# Patient Record
Sex: Female | Born: 1970 | Race: Black or African American | Hispanic: No | Marital: Single | Smoking: Former smoker
Health system: Southern US, Community
[De-identification: ages and names within clinical notes are randomized; demographics above are authoritative.]

## PROBLEM LIST (undated history)

## (undated) DIAGNOSIS — I10 Essential (primary) hypertension: Secondary | ICD-10-CM

## (undated) DIAGNOSIS — J45909 Unspecified asthma, uncomplicated: Secondary | ICD-10-CM

## (undated) HISTORY — DX: Essential (primary) hypertension: I10

---

## 1999-04-07 ENCOUNTER — Encounter: Payer: Self-pay | Admitting: Family Medicine

## 1999-04-07 ENCOUNTER — Ambulatory Visit (HOSPITAL_COMMUNITY): Admission: RE | Admit: 1999-04-07 | Discharge: 1999-04-07 | Payer: Self-pay | Admitting: Family Medicine

## 2001-12-26 ENCOUNTER — Emergency Department (HOSPITAL_COMMUNITY): Admission: EM | Admit: 2001-12-26 | Discharge: 2001-12-26 | Payer: Self-pay | Admitting: Emergency Medicine

## 2001-12-26 ENCOUNTER — Encounter: Payer: Self-pay | Admitting: Emergency Medicine

## 2003-03-26 ENCOUNTER — Emergency Department: Admit: 2003-03-26 | Payer: Self-pay | Source: Emergency Department | Admitting: Emergency Medicine

## 2003-10-23 ENCOUNTER — Emergency Department: Admit: 2003-10-23 | Payer: Self-pay | Source: Emergency Department

## 2005-05-16 ENCOUNTER — Emergency Department: Admit: 2005-05-16 | Payer: Self-pay | Source: Emergency Department | Admitting: Emergency Medicine

## 2008-06-07 ENCOUNTER — Emergency Department: Admit: 2008-06-07 | Payer: Self-pay | Source: Emergency Department | Admitting: Emergency Medicine

## 2008-06-07 LAB — CBC AND DIFFERENTIAL
Basophils Absolute: 0 /mm3 (ref 0.0–0.2)
Basophils: 0 % (ref 0–2)
Eosinophils Absolute: 0.1 /mm3 (ref 0.0–0.7)
Eosinophils: 2 % (ref 0–5)
Granulocytes Absolute: 4.3 /mm3 (ref 1.8–8.1)
Hematocrit: 39 % (ref 37.0–47.0)
Hgb: 13 G/DL (ref 12.0–16.0)
Immature Granulocytes Absolute: 0
Immature Granulocytes: 0 %
Lymphocytes Absolute: 1.1 /mm3 (ref 0.5–4.4)
Lymphocytes: 18 % (ref 15–41)
MCH: 28.3 PG (ref 28.0–32.0)
MCHC: 33.3 G/DL (ref 32.0–36.0)
MCV: 85 FL (ref 80.0–100.0)
MPV: 10.5 FL (ref 9.4–12.3)
Monocytes Absolute: 0.4 /mm3 (ref 0.0–1.2)
Monocytes: 6 % (ref 0–11)
Neutrophils %: 73 % (ref 52–75)
Platelets: 362 /mm3 (ref 140–400)
RBC: 4.59 /mm3 (ref 4.20–5.40)
RDW: 15.4 % — ABNORMAL HIGH (ref 11.5–15.0)
WBC: 5.93 /mm3 (ref 3.50–10.80)

## 2008-06-07 LAB — BASIC METABOLIC PANEL
BUN: 7 mg/dL — ABNORMAL LOW (ref 8–20)
CO2: 25 mEq/L (ref 21–30)
Calcium: 9.8 mg/dL (ref 8.6–10.2)
Chloride: 109 mEq/L — ABNORMAL HIGH (ref 98–107)
Creatinine: 0.9 mg/dL (ref 0.6–1.5)
Glucose: 88 mg/dL (ref 70–100)
Potassium: 4.1 mEq/L (ref 3.6–5.0)
Sodium: 141 mEq/L (ref 136–146)

## 2008-06-07 LAB — GFR

## 2009-09-29 ENCOUNTER — Emergency Department: Admit: 2009-09-29 | Payer: Self-pay | Source: Emergency Department | Admitting: Emergency Medicine

## 2009-09-29 LAB — COMPREHENSIVE METABOLIC PANEL
ALT: 13 U/L (ref 0–55)
AST (SGOT): 22 U/L (ref 5–34)
Albumin/Globulin Ratio: 1.2 (ref 0.9–2.2)
Albumin: 3.8 g/dL (ref 3.5–5.0)
Alkaline Phosphatase: 53 U/L (ref 40–150)
BUN: 12 mg/dL (ref 7.0–19.0)
Bilirubin, Total: 0.3 mg/dL (ref 0.2–1.2)
CO2: 17 mEq/L — ABNORMAL LOW (ref 22–29)
Calcium: 9.3 mg/dL (ref 8.5–10.5)
Chloride: 111 mEq/L — ABNORMAL HIGH (ref 98–107)
Creatinine: 0.8 mg/dL (ref 0.6–1.0)
Globulin: 3.3 g/dL (ref 2.0–3.6)
Glucose: 81 mg/dL (ref 70–100)
Potassium: 4.3 mEq/L (ref 3.5–5.1)
Protein, Total: 7.1 g/dL (ref 6.0–8.3)
Sodium: 144 mEq/L (ref 136–145)

## 2009-09-29 LAB — CBC AND DIFFERENTIAL
Basophils Absolute: 0 10*3/uL (ref 0.00–0.20)
Basophils: 0 % (ref 0–2)
Eosinophils Absolute: 0 10*3/uL (ref 0.00–0.70)
Eosinophils: 1 % (ref 0–5)
Granulocytes Absolute: 4.3 10*3/uL (ref 1.80–8.10)
Hematocrit: 37.2 % (ref 37.0–47.0)
Hgb: 12.4 g/dL (ref 12.0–16.0)
Immature Granulocytes Absolute: 0.01 10*3/uL
Immature Granulocytes: 0 % (ref 0–1)
Lymphocytes Absolute: 1.6 10*3/uL (ref 0.50–4.40)
Lymphocytes: 25 % (ref 15–41)
MCH: 28.3 pg (ref 28.0–32.0)
MCHC: 33.3 g/dL (ref 32.0–36.0)
MCV: 84.9 fL (ref 80.0–100.0)
MPV: 9.9 fL (ref 9.4–12.3)
Monocytes Absolute: 0.4 10*3/uL (ref 0.00–1.20)
Monocytes: 6 % (ref 0–11)
Neutrophils %: 67 % (ref 52–75)
Platelets: 296 10*3/uL (ref 140–400)
RBC: 4.38 10*6/uL (ref 4.20–5.40)
RDW: 15 % (ref 12–15)
WBC: 6.36 10*3/uL (ref 3.50–10.80)

## 2009-09-29 LAB — LIPASE: Lipase: 18 U/L (ref 8–78)

## 2009-09-29 LAB — HEMOLYSIS INDEX: Hemolysis Index: 23

## 2009-09-29 LAB — GFR: EGFR: >60

## 2010-03-24 ENCOUNTER — Emergency Department: Admit: 2010-03-24 | Payer: Self-pay | Source: Emergency Department | Admitting: Emergency Medicine

## 2010-03-24 LAB — HEMOLYSIS INDEX: Hemolysis Index: 21 Index — ABNORMAL HIGH (ref 0–18)

## 2010-03-24 LAB — COMPREHENSIVE METABOLIC PANEL
ALT: 12 U/L (ref 0–55)
AST (SGOT): 18 U/L (ref 5–34)
Albumin/Globulin Ratio: 1.2 (ref 0.9–2.2)
Albumin: 3.8 g/dL (ref 3.5–5.0)
Alkaline Phosphatase: 58 U/L (ref 40–150)
Anion Gap: 10 (ref 5.0–15.0)
BUN: 9 mg/dL (ref 7.0–19.0)
Bilirubin, Total: 0.6 mg/dL (ref 0.2–1.2)
CO2: 18 mEq/L — ABNORMAL LOW (ref 22–29)
Calcium: 9.2 mg/dL (ref 8.5–10.5)
Chloride: 108 mEq/L — ABNORMAL HIGH (ref 98–107)
Creatinine: 0.8 mg/dL (ref 0.6–1.0)
Globulin: 3.3 g/dL (ref 2.0–3.6)
Glucose: 102 mg/dL — ABNORMAL HIGH (ref 70–100)
Potassium: 4 mEq/L (ref 3.5–5.1)
Protein, Total: 7.1 g/dL (ref 6.0–8.3)
Sodium: 136 mEq/L (ref 136–145)

## 2010-03-24 LAB — CBC AND DIFFERENTIAL
Baso(Absolute): 0.02 10*3/uL (ref 0.00–0.20)
Basophils: 0 % (ref 0–2)
Eosinophils Absolute: 0.14 10*3/uL (ref 0.00–0.70)
Eosinophils: 3 % (ref 0–5)
Hematocrit: 34.6 % — ABNORMAL LOW (ref 37.0–47.0)
Hgb: 11.8 g/dL — ABNORMAL LOW (ref 12.0–16.0)
Immature Granulocytes Absolute: 0.01 10*3/uL
Immature Granulocytes: 0 % (ref 0–1)
Lymphocytes Absolute: 1.95 10*3/uL (ref 0.50–4.40)
Lymphocytes: 44 % — ABNORMAL HIGH (ref 15–41)
MCH: 27.4 pg — ABNORMAL LOW (ref 28.0–32.0)
MCHC: 34.1 g/dL (ref 32.0–36.0)
MCV: 80.5 fL (ref 80.0–100.0)
MPV: 10.2 fL (ref 9.4–12.3)
Monocytes Absolute: 0.31 10*3/uL (ref 0.00–1.20)
Monocytes: 7 % (ref 0–11)
Neutrophils Absolute: 2.01 10*3/uL
Neutrophils: 45 % — ABNORMAL LOW (ref 52–75)
Platelets: 291 10*3/uL (ref 140–400)
RBC: 4.3 10*6/uL (ref 4.20–5.40)
RDW: 16 % — ABNORMAL HIGH (ref 12–15)
WBC: 4.43 10*3/uL (ref 3.50–10.80)

## 2010-03-24 LAB — D-DIMER - SOFT: D-Dimer: 138 ng/ml DDU (ref 0–399)

## 2010-03-24 LAB — GFR: EGFR: >60

## 2010-03-24 LAB — TROPONIN I: Troponin I: <0.01 ng/mL (ref 0.00–0.09)

## 2010-06-04 ENCOUNTER — Emergency Department: Admit: 2010-06-04 | Payer: Self-pay | Source: Emergency Department | Admitting: Emergency Medicine

## 2010-06-04 LAB — COMPREHENSIVE METABOLIC PANEL
ALT: 6 U/L (ref 0–55)
AST (SGOT): 13 U/L (ref 5–34)
Albumin/Globulin Ratio: 1 (ref 0.9–2.2)
Albumin: 3.5 g/dL (ref 3.5–5.0)
Alkaline Phosphatase: 61 U/L (ref 40–150)
Anion Gap: 8 (ref 5.0–15.0)
BUN: 10 mg/dL (ref 7.0–19.0)
Bilirubin, Total: 0.2 mg/dL (ref 0.2–1.2)
CO2: 25 mEq/L (ref 22–29)
Calcium: 9.8 mg/dL (ref 8.5–10.5)
Chloride: 105 mEq/L (ref 98–107)
Creatinine: 0.8 mg/dL (ref 0.6–1.0)
Globulin: 3.6 g/dL (ref 2.0–3.6)
Glucose: 91 mg/dL (ref 70–100)
Potassium: 4 mEq/L (ref 3.5–5.1)
Protein, Total: 7.1 g/dL (ref 6.0–8.3)
Sodium: 138 mEq/L (ref 136–145)

## 2010-06-04 LAB — URINALYSIS, REFLEX TO MICROSCOPIC EXAM IF INDICATED
Bilirubin, UA: NEGATIVE
Blood, UA: NEGATIVE
Glucose, UA: NEGATIVE
Ketones UA: NEGATIVE
Leukocyte Esterase, UA: NEGATIVE
Nitrite, UA: NEGATIVE
Protein, UR: NEGATIVE
Specific Gravity UA POCT: 1.015 (ref 1.001–1.035)
Urine pH: 6 (ref 5.0–8.0)
Urobilinogen, UA: 2 mg/dL

## 2010-06-04 LAB — CBC AND DIFFERENTIAL
Baso(Absolute): 0.02 10*3/uL (ref 0.00–0.20)
Basophils: 0 % (ref 0–2)
Eosinophils Absolute: 0.32 10*3/uL (ref 0.00–0.70)
Eosinophils: 5 % (ref 0–5)
Hematocrit: 35.5 % — ABNORMAL LOW (ref 37.0–47.0)
Hgb: 12 g/dL (ref 12.0–16.0)
Immature Granulocytes Absolute: 0.01 10*3/uL
Immature Granulocytes: 0 % (ref 0–1)
Lymphocytes Absolute: 2.63 10*3/uL (ref 0.50–4.40)
Lymphocytes: 42 % — ABNORMAL HIGH (ref 15–41)
MCH: 28.2 pg (ref 28.0–32.0)
MCHC: 33.8 g/dL (ref 32.0–36.0)
MCV: 83.5 fL (ref 80.0–100.0)
MPV: 10 fL (ref 9.4–12.3)
Monocytes Absolute: 0.44 10*3/uL (ref 0.00–1.20)
Monocytes: 7 % (ref 0–11)
Neutrophils Absolute: 2.85 10*3/uL
Neutrophils: 46 % — ABNORMAL LOW (ref 52–75)
Platelets: 330 10*3/uL (ref 140–400)
RBC: 4.25 10*6/uL (ref 4.20–5.40)
RDW: 16 % — ABNORMAL HIGH (ref 12–15)
WBC: 6.26 10*3/uL (ref 3.50–10.80)

## 2010-06-04 LAB — TROPONIN I: Troponin I: <0.01 ng/mL (ref 0.00–0.09)

## 2010-06-04 LAB — GFR: EGFR: >60

## 2010-06-04 LAB — HEMOLYSIS INDEX: Hemolysis Index: 11 Index (ref 0–18)

## 2011-05-22 LAB — ECG 12-LEAD
Atrial Rate: 73 {beats}/min
P Axis: 64 degrees
P-R Interval: 150 ms
Q-T Interval: 374 ms
QRS Duration: 88 ms
QTC Calculation (Bezet): 412 ms
R Axis: 40 degrees
T Axis: 18 degrees
Ventricular Rate: 73 {beats}/min

## 2011-05-23 LAB — ECG 12-LEAD
Atrial Rate: 76 {beats}/min
P Axis: 54 degrees
P-R Interval: 152 ms
Q-T Interval: 372 ms
QRS Duration: 94 ms
QTC Calculation (Bezet): 418 ms
R Axis: 30 degrees
T Axis: 29 degrees
Ventricular Rate: 76 {beats}/min

## 2011-06-06 LAB — ECG 12-LEAD
Atrial Rate: 98 {beats}/min
P Axis: 108 degrees
P-R Interval: 130 ms
Q-T Interval: 352 ms
QRS Duration: 88 ms
QTC Calculation (Bezet): 449 ms
R Axis: 134 degrees
T Axis: 152 degrees
Ventricular Rate: 98 {beats}/min

## 2011-07-27 ENCOUNTER — Emergency Department
Admit: 2011-07-27 | Disposition: A | Discharge status: Home / Self Care | Payer: Self-pay | Source: Emergency Department | Admitting: Emergency Medicine

## 2011-07-27 LAB — COMPREHENSIVE METABOLIC PANEL
ALT: 7 U/L (ref 0–55)
AST (SGOT): 15 U/L (ref 5–34)
Albumin/Globulin Ratio: 1.1 (ref 0.9–2.2)
Albumin: 4.1 g/dL (ref 3.5–5.0)
Alkaline Phosphatase: 69 U/L (ref 40–150)
Anion Gap: 9 (ref 5.0–15.0)
BUN: 9 mg/dL (ref 7.0–19.0)
Bilirubin, Total: 0.3 mg/dL (ref 0.2–1.2)
CO2: 23 mEq/L (ref 22–29)
Calcium: 9.9 mg/dL (ref 8.5–10.5)
Chloride: 108 mEq/L — ABNORMAL HIGH (ref 98–107)
Creatinine: 1 mg/dL (ref 0.6–1.0)
Globulin: 3.7 g/dL — ABNORMAL HIGH (ref 2.0–3.6)
Glucose: 93 mg/dL (ref 70–100)
Potassium: 3.7 mEq/L (ref 3.5–5.1)
Protein, Total: 7.8 g/dL (ref 6.0–8.3)
Sodium: 140 mEq/L (ref 136–145)

## 2011-07-27 LAB — CBC AND DIFFERENTIAL
Basophils Absolute Automated: 0.02 10*3/uL (ref 0.00–0.20)
Basophils Automated: 0 % (ref 0–2)
Eosinophils Absolute Automated: 0.2 10*3/uL (ref 0.00–0.70)
Eosinophils Automated: 4 % (ref 0–5)
Hematocrit: 39.4 % (ref 37.0–47.0)
Hgb: 13.2 g/dL (ref 12.0–16.0)
Immature Granulocytes Absolute: 0.01 10*3/uL
Immature Granulocytes: 0 % (ref 0–1)
Lymphocytes Absolute Automated: 2.63 10*3/uL (ref 0.50–4.40)
Lymphocytes Automated: 48 % — ABNORMAL HIGH (ref 15–41)
MCH: 28.1 pg (ref 28.0–32.0)
MCHC: 33.5 g/dL (ref 32.0–36.0)
MCV: 83.8 fL (ref 80.0–100.0)
MPV: 10 fL (ref 9.4–12.3)
Monocytes Absolute Automated: 0.27 10*3/uL (ref 0.00–1.20)
Monocytes: 5 % (ref 0–11)
Neutrophils Absolute: 2.36 10*3/uL (ref 1.80–8.10)
Neutrophils: 43 % — ABNORMAL LOW (ref 52–75)
Nucleated RBC: 0 /100 WBC
Platelets: 383 10*3/uL (ref 140–400)
RBC: 4.7 10*6/uL (ref 4.20–5.40)
RDW: 15 % (ref 12–15)
WBC: 5.48 10*3/uL (ref 3.50–10.80)

## 2011-07-27 LAB — GFR: EGFR: >60

## 2011-07-27 LAB — HEMOLYSIS INDEX: Hemolysis Index: 5 Index (ref 0–18)

## 2011-07-27 LAB — TROPONIN I: Troponin I: 0.01 ng/mL (ref 0.00–0.09)

## 2011-07-29 LAB — ECG 12-LEAD
Atrial Rate: 72 {beats}/min
P Axis: 56 degrees
P-R Interval: 146 ms
Q-T Interval: 396 ms
QRS Duration: 86 ms
QTC Calculation (Bezet): 433 ms
R Axis: 14 degrees
T Axis: 26 degrees
Ventricular Rate: 72 {beats}/min

## 2011-08-08 ENCOUNTER — Emergency Department
Admit: 2011-08-08 | Discharge: 2011-08-08 | Disposition: A | Discharge status: Home / Self Care | Payer: Self-pay | Source: Emergency Department | Admitting: Emergency Medicine

## 2011-08-09 LAB — ECG 12-LEAD
Atrial Rate: 89 {beats}/min
P Axis: 77 degrees
P-R Interval: 136 ms
Q-T Interval: 348 ms
QRS Duration: 88 ms
QTC Calculation (Bezet): 423 ms
R Axis: 57 degrees
T Axis: 39 degrees
Ventricular Rate: 89 {beats}/min

## 2011-09-03 ENCOUNTER — Inpatient Hospital Stay
Admission: EM | Admit: 2011-09-03 | Disposition: A | Discharge status: Home / Self Care | Payer: Self-pay | Source: Emergency Department | Attending: Internal Medicine | Admitting: Internal Medicine

## 2011-09-03 LAB — CBC AND DIFFERENTIAL
Basophils Absolute Automated: 0.03 10*3/uL (ref 0.00–0.20)
Basophils Automated: 0 % (ref 0–2)
Eosinophils Absolute Automated: 0.16 10*3/uL (ref 0.00–0.70)
Eosinophils Automated: 3 % (ref 0–5)
Hematocrit: 38.5 % (ref 37.0–47.0)
Hgb: 13 g/dL (ref 12.0–16.0)
Immature Granulocytes Absolute: 0.04 10*3/uL
Immature Granulocytes: 1 % (ref 0–1)
Lymphocytes Absolute Automated: 3.04 10*3/uL (ref 0.50–4.40)
Lymphocytes Automated: 53 % — ABNORMAL HIGH (ref 15–41)
MCH: 28.1 pg (ref 28.0–32.0)
MCHC: 33.8 g/dL (ref 32.0–36.0)
MCV: 83.3 fL (ref 80.0–100.0)
MPV: 9.7 fL (ref 9.4–12.3)
Monocytes Absolute Automated: 0.46 10*3/uL (ref 0.00–1.20)
Monocytes: 8 % (ref 0–11)
Neutrophils Absolute: 2.05 10*3/uL (ref 1.80–8.10)
Neutrophils: 36 % — ABNORMAL LOW (ref 52–75)
Nucleated RBC: 1 /100 WBC
Platelets: 375 10*3/uL (ref 140–400)
RBC: 4.62 10*6/uL (ref 4.20–5.40)
RDW: 15 % (ref 12–15)
WBC: 5.74 10*3/uL (ref 3.50–10.80)

## 2011-09-03 LAB — COMPREHENSIVE METABOLIC PANEL
ALT: 13 U/L (ref 0–55)
AST (SGOT): 18 U/L (ref 5–34)
Albumin/Globulin Ratio: 1.2 (ref 0.9–2.2)
Albumin: 4.4 g/dL (ref 3.5–5.0)
Alkaline Phosphatase: 55 U/L (ref 40–150)
Anion Gap: 12 (ref 5.0–15.0)
BUN: 10 mg/dL (ref 7.0–19.0)
Bilirubin, Total: 0.7 mg/dL (ref 0.2–1.2)
CO2: 20 mEq/L — ABNORMAL LOW (ref 22–29)
Calcium: 10 mg/dL (ref 8.5–10.5)
Chloride: 104 mEq/L (ref 98–107)
Creatinine: 0.9 mg/dL (ref 0.6–1.0)
Globulin: 3.8 g/dL — ABNORMAL HIGH (ref 2.0–3.6)
Glucose: 87 mg/dL (ref 70–100)
Potassium: 3.8 mEq/L (ref 3.5–5.1)
Protein, Total: 8.2 g/dL (ref 6.0–8.3)
Sodium: 136 mEq/L (ref 136–145)

## 2011-09-03 LAB — HEMOLYSIS INDEX: Hemolysis Index: 6 Index (ref 0–18)

## 2011-09-03 LAB — GFR: EGFR: >60

## 2011-09-03 LAB — TROPONIN I: Troponin I: <0.01 ng/mL (ref 0.00–0.09)

## 2011-09-04 NOTE — H&P (Signed)
KAGAN, HIETPAS                                          MRN:          [id number]                                                          Account:      [id number]                                        Document ID:  [id number]                                                                                                                                        Admit Date: 09/03/2011     Patient Location: A2311-B  Patient Type: I     ATTENDING PHYSICIAN: Peyton Najjar, MD        PRESENTING COMPLAINT:  Shortness of breath.     HISTORY OF PRESENT ILLNESS:  The patient is a 41 year old female with a history of asthma.  The patient  recently came to the hospital, had asthmatic exacerbation.  The patient  said that she got short of breath yesterday.  It was severe.  The patient  now is coughing up yellowish sputum.  The patient admitted to the hospital  for asthma exacerbation.     PAST MEDICAL HISTORY:  Asthma, hypertension.     SOCIAL HISTORY:  The patient is a nonsmoker, does not abuse any alcohol.     FAMILY HISTORY:  Positive for asthma.     ALLERGIES:  PENICILLIN.     CURRENT MEDICATIONS:  Qvar, hydrochlorothiazide, ProAir.     REVIEW OF SYSTEMS:  HEENT, neck, chest, abdomen, pelvis, genitourinary, extremity,  musculoskeletal, central nervous system, psychiatric, endocrine,  hematologic, oncologic, dermatologic are unremarkable except in history of  present illness and past medical history.     PHYSICAL EXAMINATION:  GENERAL:  Ms. Wendy Preston is a 41 year old female lying down in the bed.  The  patient is ill-appearing.  VITAL SIGNS:  Temperature 100.3, pulse 85, blood pressure 132/79,  respirations 16.  HEENT:  Head is normocephalic, atraumatic.  Eyes:  Conjunctivae are pink.  Page 1 of 2  Wendy Preston, Wendy Preston                                          MRN:          [id number]                                                           Account:      [id number]                                        Document ID:  [id number]                                                                                                                                        Pupils equal, round, reactive to light and accommodation.  NECK:  Supple, no thyromegaly.  CHEST:  Decreased air entry at present, scattered wheezing, no respiratory  distress.  CARDIOVASCULAR:  Regular rate, rhythm, no peripheral edema.  ABDOMEN:  Soft, nontender, bowel sounds are present.  GENITOURINARY:  Deferred.  CENTRAL NERVOUS SYSTEM:  Sensorimotor system normal.  PSYCHIATRIC:  Affect is appropriate, alert and oriented x3.  SKIN:  Warm, no ulceration.     LABORATORY DATA:  CBC unremarkable.  CMP essentially unremarkable.  Chest x-ray does not  reveal any acute infiltrate.     IMPRESSION:  1.  Asthmatic exacerbation.  2.  Hypertension.     PLAN:  Admit to the hospital as an inpatient.  The patient is started on IV  steroids, nebulizers, and IV antibiotics.           Electronic Signing Provider     D:  09/04/2011 16:38 PM by Dr. Peyton Najjar, MD ([id number])  T:  09/04/2011 16:57 PM by [id number]        cc:     Wendy Neighbors MD                                                                                                           Page 2 of  2  Authenticated and Edited by Peyton Najjar, MD ([id number]) On 09/04/11 8:32:46 PM

## 2011-09-05 LAB — ECG 12-LEAD
Atrial Rate: 90 {beats}/min
P Axis: 76 degrees
P-R Interval: 144 ms
Q-T Interval: 366 ms
QRS Duration: 86 ms
QTC Calculation (Bezet): 447 ms
R Axis: 67 degrees
T Axis: 41 degrees
Ventricular Rate: 90 {beats}/min

## 2011-09-17 NOTE — Discharge Summary (Signed)
HAILY, CALEY                                        MRN:          [id number]                                                          Account:      [id number]                                        Document ID:  [id number]                                                                                                                                        Admit Date: 09/03/2011  Discharge Date: 09/06/2011     ATTENDING PHYSICIAN:  Bing Neighbors, MD        DISCHARGE DIAGNOSES:  1.  Acute exacerbation of asthma, improving.  2.  Hypertension.     DISCHARGE MEDICATIONS:  Advair 250/50 one inhalation twice daily, albuterol 2 puffs every 4 hours  as needed for shortness of breath, prednisone on tapering regimen, Levaquin  500 mg daily, hydrochlorothiazide 25 mg daily.     REASON FOR ADMISSION:  Shortness of breath.     HOSPITAL COURSE:  In brief, the patient is a young 41 year old female with a past medical  history significant for asthma, who presented to the emergency room with  chief complaint of worsening shortness of breath along with productive  sputum.  The patient was admitted to the hospital with a diagnosis of acute  exacerbation of asthma.  She was treated with intravenous steroids,  nebulizers, and antibiotics.  With continuous medical management, her  symptoms have improved.  She is now being discharged home in stable  condition.     The patient is requested followup with myself in a period of 3 to 4 weeks.           Electronic Signing Provider     D:  09/06/2011 14:25 PM by Dr. Ladona Mow. Marney Doctor, MD ([id number])  T:  09/06/2011 16:44 PM by [id number]           cc:  Page 1 of 1  Authenticated by Tona Sensing, MD ([id number]) On 09/17/2011 07:15:05 PM

## 2012-01-03 ENCOUNTER — Emergency Department
Admit: 2012-01-03 | Discharge: 2012-01-03 | Disposition: A | Discharge status: Home / Self Care | Payer: Self-pay | Source: Emergency Department | Admitting: Emergency Medicine

## 2012-01-03 LAB — BASIC METABOLIC PANEL
Anion Gap: 11 (ref 5.0–15.0)
BUN: 7 mg/dL (ref 7.0–19.0)
CO2: 20 mEq/L — ABNORMAL LOW (ref 22–29)
Calcium: 9.8 mg/dL (ref 8.5–10.5)
Chloride: 108 mEq/L — ABNORMAL HIGH (ref 98–107)
Creatinine: 0.8 mg/dL (ref 0.6–1.0)
Glucose: 120 mg/dL — ABNORMAL HIGH (ref 70–100)
Potassium: 3.8 mEq/L (ref 3.5–5.1)
Sodium: 139 mEq/L (ref 136–145)

## 2012-01-03 LAB — CBC AND DIFFERENTIAL
Basophils Absolute Automated: 0.04 10*3/uL (ref 0.00–0.20)
Basophils Automated: 1 % (ref 0–2)
Eosinophils Absolute Automated: 0.19 10*3/uL (ref 0.00–0.70)
Eosinophils Automated: 4 % (ref 0–5)
Hematocrit: 35.9 % — ABNORMAL LOW (ref 37.0–47.0)
Hgb: 11.6 g/dL — ABNORMAL LOW (ref 12.0–16.0)
Immature Granulocytes Absolute: 0.01 10*3/uL
Immature Granulocytes: 0 % (ref 0–1)
Lymphocytes Absolute Automated: 2.21 10*3/uL (ref 0.50–4.40)
Lymphocytes Automated: 47 % — ABNORMAL HIGH (ref 15–41)
MCH: 27 pg — ABNORMAL LOW (ref 28.0–32.0)
MCHC: 32.3 g/dL (ref 32.0–36.0)
MCV: 83.7 fL (ref 80.0–100.0)
MPV: 9.9 fL (ref 9.4–12.3)
Monocytes Absolute Automated: 0.36 10*3/uL (ref 0.00–1.20)
Monocytes: 8 % (ref 0–11)
Neutrophils Absolute: 1.88 10*3/uL (ref 1.80–8.10)
Neutrophils: 40 % — ABNORMAL LOW (ref 52–75)
Nucleated RBC: 0 /100 WBC
Platelets: 397 10*3/uL (ref 140–400)
RBC: 4.29 10*6/uL (ref 4.20–5.40)
RDW: 15 % (ref 12–15)
WBC: 4.68 10*3/uL (ref 3.50–10.80)

## 2012-01-03 LAB — TSH: TSH: 1.06 u[IU]/mL (ref 0.35–4.94)

## 2012-01-03 LAB — GFR: EGFR: >60

## 2012-01-03 LAB — T4, FREE: T4 Free: 1.12 ng/dL (ref 0.70–1.48)

## 2012-01-03 LAB — HEMOLYSIS INDEX: Hemolysis Index: 30 Index — ABNORMAL HIGH (ref 0–18)

## 2012-01-03 LAB — MAGNESIUM: Magnesium: 1.8 mg/dL (ref 1.6–2.6)

## 2012-01-03 LAB — IHS D-DIMER: D-Dimer: 0.27 ug/mL FEU (ref 0.00–0.49)

## 2012-01-03 LAB — TROPONIN I: Troponin I: <0.01 ng/mL (ref 0.00–0.09)

## 2012-01-04 LAB — ECG 12-LEAD
Atrial Rate: 112 {beats}/min
P Axis: 57 degrees
P-R Interval: 138 ms
Q-T Interval: 334 ms
QRS Duration: 88 ms
QTC Calculation (Bezet): 455 ms
R Axis: 25 degrees
T Axis: 25 degrees
Ventricular Rate: 112 {beats}/min

## 2012-08-24 ENCOUNTER — Emergency Department: Payer: Self-pay

## 2012-08-24 ENCOUNTER — Emergency Department
Admission: EM | Admit: 2012-08-24 | Discharge: 2012-08-24 | Disposition: A | Discharge status: Home / Self Care | Payer: Self-pay | Attending: Emergency Medicine | Admitting: Emergency Medicine

## 2012-08-24 DIAGNOSIS — J45909 Unspecified asthma, uncomplicated: Secondary | ICD-10-CM | POA: Insufficient documentation

## 2012-08-24 DIAGNOSIS — R1033 Periumbilical pain: Secondary | ICD-10-CM | POA: Insufficient documentation

## 2012-08-24 DIAGNOSIS — F17200 Nicotine dependence, unspecified, uncomplicated: Secondary | ICD-10-CM | POA: Insufficient documentation

## 2012-08-24 DIAGNOSIS — B9789 Other viral agents as the cause of diseases classified elsewhere: Secondary | ICD-10-CM | POA: Insufficient documentation

## 2012-08-24 HISTORY — DX: Unspecified asthma, uncomplicated: J45.909

## 2012-08-24 LAB — CBC AND DIFFERENTIAL
Basophils Absolute Automated: 0.01 10*3/uL (ref 0.00–0.20)
Basophils Automated: 0 % (ref 0–2)
Eosinophils Absolute Automated: 0.05 10*3/uL (ref 0.00–0.70)
Eosinophils Automated: 1 % (ref 0–5)
Hematocrit: 34.8 % — ABNORMAL LOW (ref 37.0–47.0)
Hgb: 11.1 g/dL — ABNORMAL LOW (ref 12.0–16.0)
Immature Granulocytes Absolute: 0 10*3/uL
Immature Granulocytes: 0 % (ref 0–1)
Lymphocytes Absolute Automated: 0.72 10*3/uL (ref 0.50–4.40)
Lymphocytes Automated: 16 % (ref 15–41)
MCH: 26.1 pg — ABNORMAL LOW (ref 28.0–32.0)
MCHC: 31.9 g/dL — ABNORMAL LOW (ref 32.0–36.0)
MCV: 81.9 fL (ref 80.0–100.0)
MPV: 10.2 fL (ref 9.4–12.3)
Monocytes Absolute Automated: 0.27 10*3/uL (ref 0.00–1.20)
Monocytes: 6 % (ref 0–11)
Neutrophils Absolute: 3.33 10*3/uL (ref 1.80–8.10)
Neutrophils: 76 % — ABNORMAL HIGH (ref 52–75)
Nucleated RBC: 0 /100 WBC (ref 0–1)
Platelets: 386 10*3/uL (ref 140–400)
RBC: 4.25 10*6/uL (ref 4.20–5.40)
RDW: 16 % — ABNORMAL HIGH (ref 12–15)
WBC: 4.38 10*3/uL (ref 3.50–10.80)

## 2012-08-24 LAB — POCT PREGNANCY TEST, URINE HCG: POCT Pregnancy HCG Test, UR: NEGATIVE

## 2012-08-24 LAB — COMPREHENSIVE METABOLIC PANEL
ALT: 8 U/L (ref 0–55)
AST (SGOT): 14 U/L (ref 5–34)
Albumin/Globulin Ratio: 1.1 (ref 0.9–2.2)
Albumin: 3.5 g/dL (ref 3.5–5.0)
Alkaline Phosphatase: 56 U/L (ref 40–150)
Anion Gap: 11 (ref 5.0–15.0)
BUN: 9 mg/dL (ref 7.0–19.0)
Bilirubin, Total: 0.6 mg/dL (ref 0.2–1.2)
CO2: 17 mEq/L — ABNORMAL LOW (ref 22–29)
Calcium: 8.7 mg/dL (ref 8.5–10.5)
Chloride: 109 mEq/L — ABNORMAL HIGH (ref 98–107)
Creatinine: 0.8 mg/dL (ref 0.6–1.0)
Globulin: 3.3 g/dL (ref 2.0–3.6)
Glucose: 101 mg/dL — ABNORMAL HIGH (ref 70–100)
Potassium: 4 mEq/L (ref 3.5–5.1)
Protein, Total: 6.8 g/dL (ref 6.0–8.3)
Sodium: 137 mEq/L (ref 136–145)

## 2012-08-24 LAB — HEMOLYSIS INDEX: Hemolysis Index: 1 Index (ref 0–18)

## 2012-08-24 LAB — POCT URINALYSIS AUTOMATED (IAH)
Bilirubin, UA POCT: NEGATIVE
Glucose, UA POCT: NEGATIVE
Ketones, UA POCT: NEGATIVE mg/dL
Nitrite, UA POCT: NEGATIVE
PH, UA POCT: 6 (ref 4.6–8)
Protein, UA POCT: NEGATIVE mg/dL
Specific Gravity, UA POCT: 1.025 mg/dL (ref 1.001–1.035)
Urine Leukocytes POCT: NEGATIVE
Urobilinogen, UA POCT: 4 mg/dL — AB

## 2012-08-24 LAB — LIPASE: Lipase: 17 U/L (ref 8–78)

## 2012-08-24 LAB — GFR: EGFR: >60

## 2012-08-24 MED ORDER — DIPHENHYDRAMINE HCL 50 MG/ML IJ SOLN
INTRAMUSCULAR | Status: AC
Start: 2012-08-24 — End: 2012-08-24
  Administered 2012-08-24: 50 mg via INTRAVENOUS
  Filled 2012-08-24: qty 1

## 2012-08-24 MED ORDER — KETOROLAC TROMETHAMINE 30 MG/ML IJ SOLN
10.00 mg | Freq: Once | INTRAMUSCULAR | Status: AC
Start: 2012-08-24 — End: 2012-08-24
  Administered 2012-08-24: 9.9 mg via INTRAVENOUS
  Filled 2012-08-24: qty 1

## 2012-08-24 MED ORDER — ONDANSETRON HCL 4 MG/2ML IJ SOLN
4.00 mg | Freq: Once | INTRAMUSCULAR | Status: AC
Start: 2012-08-24 — End: 2012-08-24
  Administered 2012-08-24: 4 mg via INTRAVENOUS
  Filled 2012-08-24: qty 2

## 2012-08-24 MED ORDER — LORAZEPAM 2 MG/ML IJ SOLN
1.00 mg | Freq: Once | INTRAMUSCULAR | Status: DC
Start: 2012-08-24 — End: 2012-08-24

## 2012-08-24 MED ORDER — DEXAMETHASONE SOD PHOSPHATE PF 10 MG/ML IJ SOLN
10.00 mg | Freq: Once | INTRAMUSCULAR | Status: AC
Start: 2012-08-24 — End: 2012-08-24
  Administered 2012-08-24: 10 mg via INTRAVENOUS
  Filled 2012-08-24: qty 1

## 2012-08-24 MED ORDER — SODIUM CHLORIDE 0.9 % IV BOLUS
1000.00 mL | Freq: Once | INTRAVENOUS | Status: AC
Start: 2012-08-24 — End: 2012-08-24
  Administered 2012-08-24: 1000 mL via INTRAVENOUS

## 2012-08-24 MED ORDER — METOCLOPRAMIDE HCL 5 MG/ML IJ SOLN
10.00 mg | Freq: Once | INTRAMUSCULAR | Status: AC
Start: 2012-08-24 — End: 2012-08-24
  Administered 2012-08-24: 10 mg via INTRAVENOUS
  Filled 2012-08-24: qty 2

## 2012-08-24 MED ORDER — ALBUTEROL SULFATE (2.5 MG/3ML) 0.083% IN NEBU
2.50 mg | INHALATION_SOLUTION | Freq: Once | RESPIRATORY_TRACT | Status: AC
Start: 2012-08-24 — End: 2012-08-24
  Administered 2012-08-24: 2.5 mg via RESPIRATORY_TRACT
  Filled 2012-08-24: qty 3

## 2012-08-24 MED ORDER — ONDANSETRON 4 MG PO TBDP
4.0000 mg | ORAL_TABLET | Freq: Three times a day (TID) | ORAL | Status: AC | PRN
Start: 2012-08-24 — End: 2012-08-31

## 2012-08-24 MED ORDER — DIPHENHYDRAMINE HCL 50 MG/ML IJ SOLN
50.00 mg | Freq: Once | INTRAMUSCULAR | Status: AC
Start: 2012-08-24 — End: 2012-08-24

## 2012-08-24 MED ORDER — LORAZEPAM 2 MG/ML IJ SOLN
1.00 mg | Freq: Once | INTRAMUSCULAR | Status: AC
Start: 2012-08-24 — End: 2012-08-24
  Administered 2012-08-24: 1 mg via INTRAVENOUS
  Filled 2012-08-24: qty 1

## 2012-08-24 MED ORDER — IOHEXOL 300 MG/ML IJ SOLN
50.00 mL | Freq: Once | INTRAMUSCULAR | Status: AC
Start: 2012-08-24 — End: 2012-08-24
  Administered 2012-08-24: 50 mL via ORAL

## 2012-08-24 MED ORDER — IOHEXOL 350 MG/ML IV SOLN
100.00 mL | Freq: Once | INTRAVENOUS | Status: DC | PRN
Start: 2012-08-24 — End: 2012-08-24

## 2012-08-24 MED ORDER — IBUPROFEN 800 MG PO TABS
800.00 mg | ORAL_TABLET | Freq: Three times a day (TID) | ORAL | Status: AC | PRN
Start: 2012-08-24 — End: 2012-09-03

## 2012-08-24 NOTE — ED Notes (Signed)
abd pain, nausea and diarrhea since last night.

## 2012-08-24 NOTE — ED Provider Notes (Addendum)
EMERGENCY DEPARTMENT HISTORY AND PHYSICAL EXAM    Date: 08/24/2012  Patient Name: Wendy Preston  Attending Physician:  Lavonda Jumbo, MD, FACEP  Diagnosis and Treatment Plan       Clinical Impression:   1. Viral illness    2. Abdominal pain        Treatment Plan:   ED Disposition     Discharge Wendy Preston discharge to home/self care.    Condition at discharge: Stable            History of Presenting Illness     Chief Complaint   Patient presents with   . Abdominal Pain   . Nausea   . Diarrhea       History Provided By: Patient  Chief Complaint: abd pain  Onset: x last night  Timing: constant  Location: periumbilical  Quality: unable to describe  Severity: moderate  Modifying Factors: similar to previous  Associated sxs: n/v/d, weakness    Additional History: Wendy Preston is a 41 y.o. female presenting with non radiating periumbilical abd pain with associated n/v/d, weakness x last night s/o eating a questionable cinnabon. Hx of similar in the past, dx with gastritis and told to lose weight and stop smoking, but the patient has persisted. Denies bloody stools, CP, SOB, dysuria.    PCP: No primary provider on file.      Current facility-administered medications:[COMPLETED] albuterol (PROVENTIL) nebulizer solution 2.5 mg, 2.5 mg, Nebulization, Once, Jamaurion Slemmer B, MD, 2.5 mg at 08/24/12 1250;  [COMPLETED] dexamethasone (DECADRON) injection 10 mg, 10 mg, Intravenous, Once, Marc Sivertsen B, MD, 10 mg at 08/24/12 1235;  [COMPLETED] diphenhydrAMINE (BENADRYL) injection 50 mg, 50 mg, Intravenous, Once, Nishat Livingston B, MD, 50 mg at 08/24/12 1230  [COMPLETED] iohexol (OMNIPAQUE) 300 MG/ML injection 50 mL, 50 mL, Oral, Once, Brydan Downard B, MD, 50 mL at 08/24/12 1015;  iohexol (OMNIPAQUE) 350 MG/ML injection 100 mL, 100 mL, Intravenous, ONCE PRN, Cameren Odwyer, Landis Gandy, MD;  [COMPLETED] ketorolac (TORADOL) injection 9.9 mg, 9.9 mg, Intravenous, Once, Dyneshia Baccam B, MD, 9.9 mg at 08/24/12 1129  [COMPLETED]  LORazepam (ATIVAN) injection 1 mg, 1 mg, Intravenous, Once, Tramel Westbrook, Landis Gandy, MD, 1 mg at 08/24/12 1014;  LORazepam (ATIVAN) injection 1 mg, 1 mg, Intravenous, Once, Shalva Rozycki, Landis Gandy, MD;  [COMPLETED] metoclopramide (REGLAN) injection 10 mg, 10 mg, Intravenous, Once, Raedyn Klinck, Landis Gandy, MD, 10 mg at 08/24/12 1005;  [COMPLETED] ondansetron (ZOFRAN) injection 4 mg, 4 mg, Intravenous, Once, Metta Koranda, Landis Gandy, MD, 4 mg at 08/24/12 1238  [COMPLETED] sodium chloride 0.9 % bolus 1,000 mL, 1,000 mL, Intravenous, Once, Amarilis Belflower, Landis Gandy, MD, 1,000 mL at 08/24/12 1007;  [COMPLETED] sodium chloride 0.9 % bolus 1,000 mL, 1,000 mL, Intravenous, Once, Dontrail Blackwell, Landis Gandy, MD, Last Rate: 1,000 mL/hr at 08/24/12 1239, 1,000 mL at 08/24/12 1239  Current outpatient prescriptions:ibuprofen (ADVIL,MOTRIN) 800 MG tablet, Take 1 tablet (800 mg total) by mouth every 8 (eight) hours as needed for Pain., Disp: 30 tablet, Rfl: 0;  ondansetron (ZOFRAN ODT) 4 MG disintegrating tablet, Take 1 tablet (4 mg total) by mouth every 8 (eight) hours as needed for Nausea., Disp: 10 tablet, Rfl: 0    Past Medical History     Past Medical History   Diagnosis Date   . Asthma without status asthmaticus      Past Surgical History   Procedure Date   . Cesarean section        Family History     No family  history on file.    Social History     History     Social History   . Marital Status: Single     Spouse Name: N/A     Number of Children: N/A   . Years of Education: N/A     Social History Main Topics   . Smoking status: Current Some Day Smoker   . Smokeless tobacco: Not on file   . Alcohol Use: Yes   . Drug Use:    . Sexually Active: Not on file     Other Topics Concern   . Not on file     Social History Narrative   . No narrative on file       Allergies     Allergies   Allergen Reactions   . Penicillins        Review of Systems     Review of Systems   Respiratory: Negative for shortness of breath.    Cardiovascular: Negative for chest pain.    Gastrointestinal: Positive for nausea, vomiting, abdominal pain and diarrhea. Negative for blood in stool.   Genitourinary: Negative for dysuria.   Neurological: Positive for weakness.   All other systems reviewed and are negative.          Physical Exam     BP 149/75  Pulse 109  Temp 97.5 F (36.4 C)  Resp 22  Ht 1.727 m  Wt 90.719 kg  BMI 30.42 kg/m2  SpO2 97%  LMP 08/15/2012  Pulse Oximetry Analysis - Normal             Physical Examination: General appearance - alert, well appearing, and in no distress  Mental status - alert, oriented to person, place, and time  Eyes - pupils equal and reactive, extraocular eye movements intact  Nose - normal and patent, no discharge  Neck - grossly normal rom  Chest - clear to auscultation, no wheezes, rales or rhonchi, symmetric air entry  Heart - normal rate, regular rhythm  Abdomen - soft, diffuse llq  tenderness, obese, nondistended  Neurological - maex4, speech clear  Musculoskeletal - no joint tenderness, deformity or swelling  Extremities - distal blood flow grossly normal, no pedal edema  Skin - normal coloration, no rashes where visualized   Psychiatric- alert, oriented, anxious                  Diagnostic Study Results     Labs -     Results     Procedure Component Value Units Date/Time    POCT UA Clinitek AX (urine dipstick) [[id number]]  (Abnormal) Collected:08/24/12 1125     Color UA POCT Amber Updated:08/24/12 1127     Clarity UA POCT Clear      Glucose, UA POCT Negative      Bilirubin, UA POCT Negative      Ketones, UA POCT Negative mg/dL      Specific Gravity, UA POCT 1.025 mg/dL      Blood, UA POCT  Trace - lysed (A)      PH, UA POCT 6.0      Protein, UA POCT Negative mg/dL      Urobilinogen, UA POCT 4.0 (A) mg/dL      Nitrite, UA POCT Negative      Leukocytes, UA POCT Negative     POCT Pregnancy Test, Urine HCG [[id number]] Collected:08/24/12 1124     POCT QC Pass Updated:08/24/12 1125     POCT Pregnancy HCG Test, UR Negative  Comment:         Result:     Negative Value is Normal in Healthy Males or Healthy non-pregnant Females    Comprehensive metabolic panel [[id number]]  (Abnormal) Collected:08/24/12 1018    Specimen Information:Blood Updated:08/24/12 1054     Glucose 101 (H) mg/dL      BUN 9.0 mg/dL      Creatinine 0.8 mg/dL      Sodium 629 mEq/L      Potassium 4.0 mEq/L      Chloride 109 (H) mEq/L      CO2 17 (L) mEq/L      Calcium 8.7 mg/dL      Protein, Total 6.8 g/dL      Albumin 3.5 g/dL      AST (SGOT) 14 U/L      ALT 8 U/L      Alkaline Phosphatase 56 U/L      Bilirubin, Total 0.6 mg/dL      Globulin 3.3 g/dL      Albumin/Globulin Ratio 1.1      Anion Gap 11.0     Lipase [[id number]] Collected:08/24/12 1018    Specimen Information:Blood Updated:08/24/12 1054     Lipase 17 U/L     HEMOLYZED INDEX [[id number]] Collected:08/24/12 1018     Hemolyzed Index 1 Index Updated:08/24/12 1054    GFR [[id number]] Collected:08/24/12 1018     EGFR >60.0 Updated:08/24/12 1054    CBC and differential [[id number]]  (Abnormal) Collected:08/24/12 1019    Specimen Information:Blood / Blood Updated:08/24/12 1033     WBC 4.38 x10 3/uL      RBC 4.25 x10 6/uL      Hgb 11.1 (L) g/dL      Hematocrit 43.3 (L) %      MCV 81.9 fL      MCH 26.1 (L) pg      MCHC 31.9 (L) g/dL      RDW 16 (H) %      Platelets 386 x10 3/uL      MPV 10.2 fL      Neutrophils 76 (H) %      Lymphocytes Automated 16 %      Monocytes 6 %      Eosinophils Automated 1 %      Basophils Automated 0 %      Immature Granulocyte 0 %      Nucleated RBC 0 /100 WBC      Neutrophils Absolute 3.33 x10 3/uL      Abs Lymph Automated 0.72 x10 3/uL      Abs Mono Automated 0.27 x10 3/uL      Abs Eos Automated 0.05 x10 3/uL      Absolute Baso Automated 0.01 x10 3/uL      Absolute Immature Granulocyte 0.00 x10 3/uL           Radiologic Studies -   Radiology Results (24 Hour)     Procedure Component Value Units Date/Time    CT Abdomen Pelvis W IV And PO Cont [[id number]] Collected:08/24/12 1218    Order Status:Completed   Updated:08/24/12 1224    Narrative:    INDICATION: Abdominal pain     TECHNIQUE:  Spiral CT scanning was performed through the abdomen and  pelvis after the patient received oral and intravenous contrast.     COMPARISON: 09/29/2009.     FINDINGS:     Abdomen:  The lung bases are unremarkable. There are scattered well  circumscribed subcentimeter areas of low attenuation in the liver which  appear unchanged. No abnormality is identified in the spleen,  gallbladder, pancreas, adrenal glands, and kidneys.  There is no  abdominal aortic aneurysm.  The bowel loops are unremarkable.  There is  no evidence of ascites.     Pelvis:   The bladder has a normal appearance.  There is no significant  abnormality in the uterus and adnexae.  The bowel loops are  unremarkable.  The appendix has a normal appearance. There is no  ascites.  No inguinal hernia is identified.       Impression:      No evidence of acute process in the abdomen and pelvis.      .    Doctor's Notes     12:22 PM - upon leaving CT patient reports itchiness and nausea. Medications ordered accordingly. No rash, hives  12:34 PM - sxs resolved. Mild SOB, no wheezing. She reports that the aforementioned episode after CT scan was brief lasting only a couple of seconds  1:00 PM - symptom free. No rebound  1:23 PM - patient agrees with plan to d/c                  _______________________________  Medical DeMedical Decision Makingcision Making  Attestations:     Physician/Midlevel provider first contact with patient: 08/24/12 0915         This note is prepared for Lavonda Jumbo, MD, FACEP. The scribe's documentation has been prepared under my direction and personally reviewed by me in its entirety.  I confirm that the note above accurately reflects all work, treatment, procedures, and medical decision making performed by me.     I am the first provider for this patient.      Lavonda Jumbo, MD, FACEP is the primary emergency doctor of record.      I reviewed the vital  signs, available nursing notes, past medical history, past surgical history, family history and social history.    _______________________________              Donny Pique, MD  08/24/12 1451    Donny Pique, MD  08/24/12 1452

## 2012-08-24 NOTE — Discharge Instructions (Signed)
Viral Syndrome [Adult]  A viral illness may cause a number of symptoms. The symptoms depend on the part of the body that the virus affects. If it settles in the nose/throat/lungs, it may cause cough, sore throat, congestion and sometimes headache. If it settles in the stomach and intestinal tract, it may cause vomiting and diarrhea. Sometimes, it causes vague symptoms like "aching all over," feeling tired, loss of appetite, or fever.  A viral illness usually lasts from 1-2 weeks and sometimes longer. In some cases, a more serious infection can look like a viral syndrome in the first few days of the illness. Repeat exams and further tests are needed to know the difference. Therefore, it is important to watch for the warning signs listed below.  Home Care:   If symptoms are severe, rest at home for the first 2-3 days.   Stay away from cigarette smoke (yours or other peoples').   You may use Tylenol (acetaminophen) or ibuprofen (Motrin, Advil) for fever, muscle aching and headache, unless another medicine was prescribed for this. [ NOTE : If you have chronic liver or kidney disease or ever had a stomach ulcer or GI bleeding, talk with your doctor before using these medicines.] (Aspirin should never be used in anyone under 70 years of age who is ill with a fever. It may cause severe liver damage.)   Your appetite may be poor, so a light diet is fine. Avoid dehydration by drinking 8-12 eight-ounce glasses of fluids per day. This may include water, orange juice and lemonade, apple, grape and cranberry juice, clear fruit drinks, electrolyte replacement and sports drinks, decaffeinated teas and coffee.   Over-the-counter remedies will not shorten the length of the illness but may be helpful for the following symptoms: cough (Robitussin DM); sore throat (Chloraseptic lozenges or spray); nasal and sinus congestion (Actifed or Sudafed). [NOTE: Do not use decongestants if you have high blood pressure.].  Follow Up  with  your doctor as advised if you do not improve over the next week.  Get Prompt Medical Attention  if any of the following occur:   Cough with lots of colored sputum (mucus) or blood in your sputum   Chest pain, shortness of breath, wheezing or difficulty breathing   Severe headache; face, neck or ear pain   Severe constant right-sided lower abdominal pain   Continued vomiting (can't keep liquids down)   Frequent diarrhea (more than 5 times a day); blood (red or black color) or mucus in diarrhea   Feeling weak, dizzy, or like you are going to faint   Extreme thirst   Fever of 100.10F (38C) oral or higher, not better with fever medication   [employer]   [address], [address] [place], [place] [address]. All rights reserved. This information is not intended as a substitute for professional medical care. Always follow your healthcare professional's instructions.      Abdominal Pain  Abdominal pain is pain in the stomach or intestinal area. Everyone has this pain from time to time. In many cases it goes away on its own. But abdominal pain can sometimes be due to a serious problem, such as appendicitis. So it's important to know when to seek help.  Causes of abdominal pain  There are many possible causes of abdominal pain. Common causes in adults include:   Constipation, diarrhea, or gas   GERD (movement of stomach acid into the esophagus, also known as acid reflux or heartburn)   Peptic ulcer (a  sore in the lining of the stomach or small intestine)   Inflammation of the gallbladder or pancreas   Gallstones or kidney stones   Hernia (bulging of an internal organ through a muscle or other tissue)   Urinary tract infections   In women, menstrual cramps, fibroids, or endometriosis of the uterus   Inflammation or infection of the intestines  Diagnosing the cause of abdominal pain  Your health care provider will examine you to help find the cause of your pain. If needed, tests will be  ordered. Because abdominal pain has so many possible causes, it can be hard to discover the reason for the pain. Giving details about your pain can help. Be ready to tell your health care provider where and when you feel the pain and what makes it better or worse. Also mention whether you have other symptoms such as fever, tiredness, nausea, vomiting, or changes in bathroom habits.  Treating abdominal pain  Certain causes of pain, such as appendicitis or a bowel obstruction, need emergency treatment. Other problems can be treated with rest, fluids, or medications. Your health care provider can give you specific instructions for treatment or self-care based on the cause of your pain.  If you are have vomiting or diarrhea,sip water or other clear fluids. When you are ready to eat solid foods again, start with small amounts of easy-to-digest, low-fat foods, such as applesauce, toast, or crackers.   When to call the doctor  Call 911or go to the hospital right away if you:   Can't pass stool and are vomiting   Are vomiting blood or have black tarry diarrhea   Also have chest, neck, or shoulder pain   Feel like you are about to pass out   Have pain in your shoulder blades with nausea   Have sudden, excruciating abdominal pain   Have new, severepain unlike any you have felt before   Have a belly that is rigid, hard, and tender to touch  Call your doctor if you have:   Pain for more than5days   Bloating for more than2days   Diarrhea for more than5days   Feverof101For higher   Pain that continues to worsen   Unexplained weight loss   Continued lack of appetite   Blood in the stool  How to prevent abdominal pain  Here are some tips to help prevent abdominal pain:   Eat smaller amounts of food at one time.   Avoid greasy, fried, or other high-fat foods.   Avoid foods that give you gas.   Exercise regularly.   Drink plenty of fluids.  To help prevent symptoms of gastroesophageal reflux  (GERD):   Quit smoking.   Lose excess weight.   Finish eating at least 2 hours before you go to bed or lie down.   Elevate the head of your bed.   [address], [place], [place] [address]. All rights reserved. This information is not intended as a substitute for professional medical care. Always follow your healthcare professional's instructions.

## 2014-02-26 DIAGNOSIS — I10 Essential (primary) hypertension: Secondary | ICD-10-CM | POA: Insufficient documentation

## 2014-02-26 DIAGNOSIS — F17200 Nicotine dependence, unspecified, uncomplicated: Secondary | ICD-10-CM | POA: Insufficient documentation

## 2014-02-26 DIAGNOSIS — J45901 Unspecified asthma with (acute) exacerbation: Secondary | ICD-10-CM | POA: Insufficient documentation

## 2014-02-26 NOTE — ED Notes (Signed)
Pt to ed c/o chest pain x 2-3 days with SOB pt is also c/o wheezing

## 2014-02-27 ENCOUNTER — Emergency Department
Admission: EM | Admit: 2014-02-27 | Discharge: 2014-02-27 | Disposition: A | Discharge status: Home / Self Care | Payer: No Typology Code available for payment source | Attending: Emergency Medicine | Admitting: Emergency Medicine

## 2014-02-27 ENCOUNTER — Emergency Department: Payer: No Typology Code available for payment source

## 2014-02-27 DIAGNOSIS — J45901 Unspecified asthma with (acute) exacerbation: Secondary | ICD-10-CM

## 2014-02-27 LAB — POCT PREGNANCY TEST, URINE HCG: POCT Pregnancy HCG Test, UR: NEGATIVE

## 2014-02-27 LAB — ECG 12-LEAD
Atrial Rate: 86 {beats}/min
Atrial Rate: 70 {beats}/min
P Axis: 75 degrees
P Axis: 61 degrees
P-R Interval: 146 ms
P-R Interval: 148 ms
Q-T Interval: 374 ms
Q-T Interval: 410 ms
QRS Duration: 86 ms
QRS Duration: 90 ms
QTC Calculation (Bezet): 447 ms
QTC Calculation (Bezet): 442 ms
R Axis: 55 degrees
R Axis: 21 degrees
T Axis: 33 degrees
T Axis: 14 degrees
Ventricular Rate: 86 {beats}/min
Ventricular Rate: 70 {beats}/min

## 2014-02-27 LAB — CBC AND DIFFERENTIAL
Basophils Absolute Automated: 0.02 10*3/uL (ref 0.00–0.20)
Basophils Automated: 0 %
Eosinophils Absolute Automated: 0.32 10*3/uL (ref 0.00–0.70)
Eosinophils Automated: 4 %
Hematocrit: 36.8 % — ABNORMAL LOW (ref 37.0–47.0)
Hgb: 12.1 g/dL (ref 12.0–16.0)
Immature Granulocytes Absolute: 0.02 10*3/uL
Immature Granulocytes: 0 %
Lymphocytes Absolute Automated: 3.55 10*3/uL (ref 0.50–4.40)
Lymphocytes Automated: 44 %
MCH: 26.2 pg — ABNORMAL LOW (ref 28.0–32.0)
MCHC: 32.9 g/dL (ref 32.0–36.0)
MCV: 79.7 fL — ABNORMAL LOW (ref 80.0–100.0)
MPV: 9.8 fL (ref 9.4–12.3)
Monocytes Absolute Automated: 0.53 10*3/uL (ref 0.00–1.20)
Monocytes: 7 %
Neutrophils Absolute: 3.65 10*3/uL (ref 1.80–8.10)
Neutrophils: 45 %
Nucleated RBC: 0 /100 WBC (ref 0–1)
Platelets: 372 10*3/uL (ref 140–400)
RBC: 4.62 10*6/uL (ref 4.20–5.40)
RDW: 16 % — ABNORMAL HIGH (ref 12–15)
WBC: 8.07 10*3/uL (ref 3.50–10.80)

## 2014-02-27 LAB — BASIC METABOLIC PANEL
Anion Gap: 10 (ref 5.0–15.0)
BUN: 12 mg/dL (ref 7.0–19.0)
CO2: 22 mEq/L (ref 22–29)
Calcium: 10 mg/dL (ref 8.5–10.5)
Chloride: 105 mEq/L (ref 100–111)
Creatinine: 0.9 mg/dL (ref 0.6–1.0)
Glucose: 101 mg/dL — ABNORMAL HIGH (ref 70–100)
Potassium: 3.7 mEq/L (ref 3.5–5.1)
Sodium: 137 mEq/L (ref 136–145)

## 2014-02-27 LAB — HEMOLYSIS INDEX: Hemolysis Index: 6 (ref 0–18)

## 2014-02-27 LAB — GFR: EGFR: >60

## 2014-02-27 MED ORDER — ALBUTEROL SULFATE (2.5 MG/3ML) 0.083% IN NEBU
7.5000 mg | INHALATION_SOLUTION | Freq: Once | RESPIRATORY_TRACT | Status: AC
Start: 2014-02-27 — End: 2014-02-27
  Administered 2014-02-27: 7.5 mg via RESPIRATORY_TRACT
  Filled 2014-02-27: qty 9

## 2014-02-27 MED ORDER — ALBUTEROL SULFATE HFA 108 (90 BASE) MCG/ACT IN AERS
2.0000 | INHALATION_SPRAY | RESPIRATORY_TRACT | Status: AC | PRN
Start: 2014-02-27 — End: 2014-03-29

## 2014-02-27 MED ORDER — PREDNISONE 20 MG PO TABS
100.0000 mg | ORAL_TABLET | Freq: Once | ORAL | Status: AC
Start: 2014-02-27 — End: 2014-02-27
  Administered 2014-02-27: 100 mg via ORAL
  Filled 2014-02-27: qty 5

## 2014-02-27 MED ORDER — IPRATROPIUM BROMIDE 0.02 % IN SOLN
0.5000 mg | Freq: Once | RESPIRATORY_TRACT | Status: AC
Start: 2014-02-27 — End: 2014-02-27
  Administered 2014-02-27: 0.5 mg via RESPIRATORY_TRACT
  Filled 2014-02-27: qty 2.5

## 2014-02-27 MED ORDER — ALUM & MAG HYDROXIDE-SIMETH 200-200-20 MG/5ML PO SUSP
30.0000 mL | Freq: Once | ORAL | Status: AC
Start: 2014-02-27 — End: 2014-02-27
  Administered 2014-02-27: 30 mL via ORAL
  Filled 2014-02-27: qty 30

## 2014-02-27 MED ORDER — PANTOPRAZOLE SODIUM 40 MG PO TBEC
40.0000 mg | DELAYED_RELEASE_TABLET | Freq: Every day | ORAL | Status: DC
Start: 2014-02-27 — End: 2015-01-25

## 2014-02-27 MED ORDER — PREDNISONE 50 MG PO TABS
50.0000 mg | ORAL_TABLET | Freq: Every day | ORAL | Status: AC
Start: 2014-02-27 — End: 2014-03-04

## 2014-02-27 MED ORDER — LIDOCAINE VISCOUS 2 % MT SOLN
10.0000 mL | Freq: Once | OROMUCOSAL | Status: AC
Start: 2014-02-27 — End: 2014-02-27
  Administered 2014-02-27: 10 mL via OROMUCOSAL
  Filled 2014-02-27: qty 15

## 2014-02-27 NOTE — ED Provider Notes (Signed)
EMERGENCY DEPARTMENT HISTORY AND PHYSICAL EXAM     Physician/Midlevel provider first contact with patient: 02/27/14 0043         Date: 02/27/2014  Patient Name: Wendy Preston    History of Presenting Illness     Chief Complaint   Patient presents with   . Chest Pain   . Shortness of Breath   . Wheezing       History Provided By: Patient     Chief Complaint: Chest Pain   Onset: 3 days   Timing: Constant   Location: Substernal   Quality: Chest tightness   Severity: Moderate , 7/10   Modifying Factors: Worse with eating and laying down   Associated Symptoms: SOB     Additional History: Wendy Preston is a 43 y.o. female with hx of Asthma, HTN presenting to the ED for chest pain. Patient states that for the past 3 days she has been with constant substernal chest pain which is not exertional. She describes this pain as chest tightness and states it is worse with eating and laying down. Made better with sitting up. Patient also states she has been feeling SOB for the past 3 days and states it feels similar to prior Asthma flare ups. Patient has tried Albuterol w/o relief. She denies fevers, chills, nausea, vomiting, nor any other complaints. Patient states she works at a [employer]. Patient has tried Tylenol w/ little relief.     PCP: Fleurimond, Derryl Harbor, DO (General)      No current facility-administered medications for this encounter.     Current Outpatient Prescriptions   Medication Sig Dispense Refill   . albuterol (PROVENTIL HFA;VENTOLIN HFA) 108 (90 BASE) MCG/ACT inhaler Inhale 2 puffs into the lungs every 4 (four) hours as needed for Wheezing. 1 Inhaler 1   . pantoprazole (PROTONIX) 40 MG tablet Take 1 tablet (40 mg total) by mouth daily. 30 tablet 1   . predniSONE (DELTASONE) 50 MG tablet Take 1 tablet (50 mg total) by mouth daily. 5 tablet 0       Past History     Past Medical History:  Past Medical History   Diagnosis Date   . Asthma without status asthmaticus        Past Surgical History:  Past Surgical  History   Procedure Laterality Date   . Cesarean section         Family History:  History reviewed. No pertinent family history.    Social History:  History   Substance Use Topics   . Smoking status: Current Every Day Smoker   . Smokeless tobacco: Not on file   . Alcohol Use: Yes       Allergies:  Allergies   Allergen Reactions   . Penicillins        Review of Systems     Review of Systems   Constitutional: Negative for fever, chills, diaphoresis and fatigue.   HENT: Negative for ear pain, rhinorrhea and sore throat.    Eyes: Negative for visual disturbance.   Respiratory: Positive for shortness of breath. Negative for chest tightness and wheezing.    Cardiovascular: Positive for chest pain. Negative for palpitations.   Gastrointestinal: Negative for nausea, vomiting, abdominal pain, diarrhea, constipation and blood in stool.   Genitourinary: Negative for dysuria, hematuria and flank pain.   Musculoskeletal: Negative for myalgias, back pain, arthralgias, gait problem and neck pain.   Skin: Negative for color change and rash.   Neurological: Negative for dizziness, weakness,  numbness and headaches.   Hematological: Does not bruise/bleed easily.   Psychiatric/Behavioral: Negative for suicidal ideas, confusion and self-injury. The patient is not nervous/anxious.        Physical Exam   BP 155/81 mmHg  Pulse 80  Temp(Src) 98.2 F (36.8 C)  Resp 18  SpO2 99%    Physical Exam   Constitutional: She is oriented to person, place, and time and well-developed, well-nourished, and in no distress. No distress.   HENT:   Head: Normocephalic and atraumatic.   Mouth/Throat: Oropharynx is clear and moist. No oropharyngeal exudate.   Eyes: Conjunctivae and EOM are normal. Pupils are equal, round, and reactive to light. Left eye exhibits no discharge. No scleral icterus.   Neck: Normal range of motion. Neck supple. No JVD present.   Cardiovascular: Normal rate, regular rhythm, normal heart sounds and intact distal pulses.  Exam  reveals no gallop and no friction rub.    No murmur heard.  Pulmonary/Chest: Effort normal. No respiratory distress. She has wheezes. She has no rales. She exhibits no tenderness.   Abdominal: Soft. Bowel sounds are normal. She exhibits no mass. There is no tenderness. There is no rebound and no guarding.   Musculoskeletal: Normal range of motion. She exhibits no edema or tenderness.   Lymphadenopathy:     She has no cervical adenopathy.   Neurological: She is alert and oriented to person, place, and time. No cranial nerve deficit. Gait normal. GCS score is 15.   Skin: Skin is warm and dry. No rash noted. She is not diaphoretic.   Psychiatric: Mood, memory, affect and judgment normal.         Diagnostic Study Results     Labs -     Results    Procedure Component Value Units Date/Time    Urine HCG - POCT [[id number]] Collected:  02/27/14 0152    Specimen Information:  Urine Updated:  02/27/14 0152     POCT QC Pass      POCT Pregnancy HCG Test, UR Negative      Comment:        Result:        Negative Value is Normal in Healthy Males or Healthy non-pregnant Females    Basic Metabolic Panel [[id number]]  (Abnormal) Collected:  02/27/14 0122    Specimen Information:  Blood Updated:  02/27/14 0141     Glucose 101 (H) mg/dL      BUN 30.8 mg/dL      Creatinine 0.9 mg/dL      CALCIUM 65.7 mg/dL      Sodium 846 mEq/L      Potassium 3.7 mEq/L      Chloride 105 mEq/L      CO2 22 mEq/L      Anion Gap 10.0     Hemolysis index [[id number]] Collected:  02/27/14 0122     Hemolysis Index 6 Updated:  02/27/14 0141    GFR [[id number]] Collected:  02/27/14 0122     EGFR >60.0 Updated:  02/27/14 0141    CBC and differential [[id number]]  (Abnormal) Collected:  02/27/14 0122    Specimen Information:  Blood / Blood Updated:  02/27/14 0130     WBC 8.07 x10 3/uL      RBC 4.62 x10 6/uL      Hgb 12.1 g/dL      Hematocrit 47.4 (L) %      MCV 79.7 (L) fL      MCH 26.2 (L) pg  MCHC 32.9 g/dL      RDW 16 (H) %      Platelets 372 x10 3/uL      MPV  9.8 fL      Neutrophils 45 %      Lymphocytes Automated 44 %      Monocytes 7 %      Eosinophils Automated 4 %      Basophils Automated 0 %      Immature Granulocyte 0 %      Nucleated RBC 0 /100 WBC      Neutrophils Absolute 3.65 x10 3/uL      Abs Lymph Automated 3.55 x10 3/uL      Abs Mono Automated 0.53 x10 3/uL      Abs Eos Automated 0.32 x10 3/uL      Absolute Baso Automated 0.02 x10 3/uL      Absolute Immature Granulocyte 0.02 x10 3/uL           Radiologic Studies -   Radiology Results (24 Hour)    Procedure Component Value Units Date/Time    XR Chest 2 Views [[id number]]     Order Status:  Sent Updated:  02/27/14 4098      .      Medical Decision Making   I am the first provider for this patient.    I reviewed the vital signs, available nursing notes, past medical history, past surgical history, family history and social history.    Vital Signs-Reviewed the patient's vital signs.     Patient Vitals for the past 12 hrs:   BP Temp Pulse Resp   02/27/14 0155 155/81 mmHg 98.2 F (36.8 C) 80 18   02/26/14 2256 145/88 mmHg - - -   02/26/14 2255 - 98.4 F (36.9 C) - 16       Pulse Oximetry Analysis - Normal 98% on RA     Cardiac Monitor:  Rate: 74  Rhythm: NSR    EKG:  Interpreted by the EP.   Time Interpreted: 0106   Rate: 70   Rhythm: NSR, normal axis and intervals   Interpretation:No acute ischemia or infarction      Old Medical Records: Old medical records.Nursing notes.     ED Course:     Provider Notes: 43yo female smoker with 3 day exacerbation of asthma and 3 days of constant, ongoing, substernal, non-exertional chest pain made worse by laying flat and eating, better by sitting up, most consistent with GERD.  Rx Prednisone, Albuterol, Atrovent, and GI cocktail.    0248-discomfort completely relieved with GI cocktail    Difficulty breathing completely resolved with steroids/nebs    States readiness for discharge        Diagnosis     Clinical Impression:   1. Asthma exacerbation         _______________________________    Attestations:  This note is prepared by Levon Hedger , acting as Scribe for Marquis Buggy, MD The scribe's documentation has been prepared under my direction and personally reviewed by me in its entirety.  I confirm that the note above accurately reflects all work, treatment, procedures, and medical decision making performed by me.    _______________________________          Lester Kinsman, MD  02/27/14 2130

## 2014-06-20 DIAGNOSIS — J45909 Unspecified asthma, uncomplicated: Secondary | ICD-10-CM | POA: Insufficient documentation

## 2014-06-20 DIAGNOSIS — F17200 Nicotine dependence, unspecified, uncomplicated: Secondary | ICD-10-CM | POA: Insufficient documentation

## 2014-06-20 DIAGNOSIS — Z880 Allergy status to penicillin: Secondary | ICD-10-CM | POA: Insufficient documentation

## 2014-06-20 DIAGNOSIS — K088 Other specified disorders of teeth and supporting structures: Secondary | ICD-10-CM | POA: Insufficient documentation

## 2014-06-20 DIAGNOSIS — R030 Elevated blood-pressure reading, without diagnosis of hypertension: Secondary | ICD-10-CM | POA: Insufficient documentation

## 2014-06-21 ENCOUNTER — Emergency Department: Payer: No Typology Code available for payment source

## 2014-06-21 ENCOUNTER — Emergency Department
Admission: EM | Admit: 2014-06-21 | Discharge: 2014-06-21 | Disposition: A | Discharge status: Home / Self Care | Payer: Self-pay | Attending: Emergency Medicine | Admitting: Emergency Medicine

## 2014-06-21 DIAGNOSIS — IMO0001 Reserved for inherently not codable concepts without codable children: Secondary | ICD-10-CM

## 2014-06-21 DIAGNOSIS — K0889 Other specified disorders of teeth and supporting structures: Secondary | ICD-10-CM

## 2014-06-21 MED ORDER — CLINDAMYCIN HCL 300 MG PO CAPS
300.0000 mg | ORAL_CAPSULE | Freq: Three times a day (TID) | ORAL | Status: AC
Start: 2014-06-21 — End: 2014-06-28

## 2014-06-21 MED ORDER — MORPHINE SULFATE 2 MG/ML IJ/IV SOLN (WRAP)
2.0000 mg | Freq: Once | Status: AC
Start: 2014-06-21 — End: 2014-06-21
  Administered 2014-06-21: 2 mg via INTRAMUSCULAR

## 2014-06-21 MED ORDER — MORPHINE SULFATE 2 MG/ML IJ/IV SOLN (WRAP)
4.0000 mg | Freq: Once | Status: AC
Start: 2014-06-21 — End: 2014-06-21
  Administered 2014-06-21: 2 mg via INTRAMUSCULAR
  Filled 2014-06-21: qty 2

## 2014-06-21 MED ORDER — MORPHINE SULFATE 2 MG/ML IJ/IV SOLN (WRAP)
4.0000 mg | Freq: Once | Status: DC
Start: 2014-06-21 — End: 2014-06-21

## 2014-06-21 MED ORDER — HYDROCODONE-ACETAMINOPHEN 5-325 MG PO TABS
1.0000 | ORAL_TABLET | Freq: Four times a day (QID) | ORAL | Status: DC | PRN
Start: 2014-06-21 — End: 2014-07-22

## 2014-06-21 MED ORDER — IBUPROFEN 600 MG PO TABS
600.0000 mg | ORAL_TABLET | Freq: Three times a day (TID) | ORAL | Status: DC | PRN
Start: 2014-06-21 — End: 2014-07-22

## 2014-06-21 NOTE — ED Provider Notes (Signed)
EMERGENCY DEPARTMENT HISTORY AND PHYSICAL EXAM     Physician/Midlevel provider first contact with patient: 06/21/14 0013         Date: 06/21/2014  Patient Name: Wendy Preston    History of Presenting Illness     Chief Complaint   Patient presents with   . Dental Pain       History Provided By: Pt    Chief Complaint: Dental pain  Onset: 2 hours ago  Timing: Constant  Location: R lower posterior tooth  Quality: Throbbing  Severity: Severe  Modifying Factors:   Associated Symptoms: R jaw swelling    Additional History: Wendy Preston is a 43 y.o. female presents w dental pain x 2 hours. Denies drooling, inability top open mouth. Denies f/c/n/v/d.    PCP: Fleurimond, Derryl Harbor, DO      No current facility-administered medications for this encounter.     Current Outpatient Prescriptions   Medication Sig Dispense Refill   . clindamycin (CLEOCIN) 300 MG capsule Take 1 capsule (300 mg total) by mouth 3 (three) times daily. 21 capsule 0   . hydrochlorothiazide (HYDRODIURIL) 25 MG tablet      . HYDROcodone-acetaminophen (NORCO) 5-325 MG per tablet Take 1 tablet by mouth every 6 (six) hours as needed for Pain. 15 tablet 0   . ibuprofen (ADVIL,MOTRIN) 600 MG tablet Take 1 tablet (600 mg total) by mouth every 8 (eight) hours as needed for Pain or Fever. 20 tablet 0   . pantoprazole (PROTONIX) 40 MG tablet Take 1 tablet (40 mg total) by mouth daily. 30 tablet 1   . PROAIR HFA 108 (90 BASE) MCG/ACT inhaler            Past History     Past Medical History:  Past Medical History   Diagnosis Date   . Asthma without status asthmaticus        Past Surgical History:  Past Surgical History   Procedure Laterality Date   . Cesarean section         Family History:  History reviewed. No pertinent family history.    Social History:  History   Substance Use Topics   . Smoking status: Current Every Day Smoker   . Smokeless tobacco: Not on file   . Alcohol Use: Yes       Allergies:  Allergies   Allergen Reactions   . Penicillins         Review of Systems     Review of Systems   Constitutional: Negative for fever, chills and weight loss.   HENT: Negative for sore throat.    Respiratory: Negative for cough, shortness of breath, wheezing and stridor.    Cardiovascular: Negative for chest pain and palpitations.   Gastrointestinal: Negative for vomiting, abdominal pain and diarrhea.   Skin: Negative for itching and rash.   Neurological: Negative for weakness and headaches.          Physical Exam     BP 154/82 mmHg  Pulse 83  Temp(Src) 97.4 F (36.3 C)  Resp 18  Ht 1.74 m  Wt 90.719 kg  BMI 29.96 kg/m2  SpO2 96%    Physical Exam   Constitutional: She appears well-developed and well-nourished. No distress.   HENT:   Face e&s.  Jaw w trace R lower jaw swelling.  Neck e&s.  No drooling. No trismus- mouth opens to 4 finger width.  Floor of mouth sublingually soft. No lingual or posterior pharyngeal swelling.  Dentition + pain  with palpation to position 18.  + Mild lateral gingival edema.      Cardiovascular: Normal rate, regular rhythm, normal heart sounds and intact distal pulses.    Pulmonary/Chest: Effort normal and breath sounds normal. No respiratory distress. She has no wheezes.   Skin: Skin is warm and dry. She is not diaphoretic.   Nursing note and vitals reviewed.      Diagnostic Study Results     Labs -     Results     ** No results found for the last 24 hours. **          Radiologic Studies -   Radiology Results (24 Hour)     ** No results found for the last 24 hours. **          Medical Decision Making   I, Vira Browns, PA-C am the first provider for this patient.    I reviewed the vital signs, available nursing notes, past medical history, past surgical history, family history and social history, as appropriate.    Vital Signs-Reviewed the patient's vital signs.     Patient Vitals for the past 12 hrs:   BP Temp Pulse Resp   06/21/14 0044 154/82 mmHg - - -   06/21/14 0006 (!) 176/113 mmHg 97.4 F (36.3 C) 83 18       Pulse  Oximetry Analysis - 96% on RA    Assessment & Plan:  DW Dr. Conley Rolls   43 y.o. yo female pw dental pain x 2 hours  Denies CP, SOB.  Pt w elevated BP.  Will request manual read.   -Will [place] home w [place], pain meds, dental resources. Strict return precautions provided.  The patient voiced understanding.   Note: Pt has elevated BP. Possibly 2/2 pain. Will give instructions for PCP.      Diagnosis     Clinical Impression:   1. Pain, dental    2. Elevated blood pressure      Duru Reiger, Tasia Catchings, PA  06/21/14 0046    Shelie Lansing, Tasia Catchings, PA  06/21/14 2536    Annett Fabian, MD  06/21/14 [phone]

## 2014-06-21 NOTE — ED Notes (Signed)
Pt c/o dental pain since this afternoon - noted tenderness and swelling on right jaw area.

## 2014-06-21 NOTE — Discharge Instructions (Signed)
Please seen the dental resources listed below.  It is very important that you follow-up with a dentist.    Clinics: Dental    DENTAL RESOURCES      Theressa Stamps    [facility] - [phone]    [facility] - [phone]  [address] [place] [address]    [facility] [phone]  basic dental services, no restorative services available    [facility] - [phone]  requires screening by social service agency/income requirement      [facility] - [phone]  [place] [facility] [phone] [facility] [address]  annual fee $35.00 for adults and $15.00 for children under age of 24    [facility] - [phone]  [facility] [place] [facility] - [phone]  [facility] - [phone]  [facility] - [phone]      [facility] [phone]  [facility] - [phone]      OTHER RESOURCES  [facility] [place]  [phone]    [facility]  [phone]      Toothache    You have been seen for a toothache.    A toothache happens when the nerve of the tooth gets irritated. Infection, trauma, decay or cavities may cause this irritation. There may be pain after a tooth is lost (from trauma or being pulled).    Symptoms may include:   Pain with chewing.   Sensitivity to hot and cold.   The gums may get beefy red or inflamed in color.    Treatment depends on the toothache s cause. Follow up with a dentist right away for cavities and chipped teeth. Also see a dentist right away for post-extraction (after pulling) pain. Non-steroidal anti-inflammatory medicines (ibuprofen (Advil or Motrin); naproxen (Aleve, Naprosyn), etc.) can usually treat dental pain. Often, simple toothaches do not need narcotic pain medicines.    Emergency and Urgent Care  Doctors are not Dentists. Urgent Care and Emergency Department treatment IS NO SUBSTITUTE for treatment by a licensed dentist. Follow up with a dentist right away and plan to see your dentist on a regular schedule.    YOU SHOULD SEEK MEDICAL ATTENTION IMMEDIATELY, EITHER HERE OR AT THE NEAREST EMERGENCY DEPARTMENT, IF ANY OF THE FOLLOWING OCCURS:   Swelling of the face, neck, and cheeks.   High fever (temperature higher than 100.81F / 38C), chills, vomiting, signs of dehydration.   You can't swallow your saliva (spit) or medicine.

## 2014-07-22 ENCOUNTER — Emergency Department: Payer: Self-pay

## 2014-07-22 ENCOUNTER — Emergency Department
Admission: EM | Admit: 2014-07-22 | Discharge: 2014-07-22 | Disposition: A | Discharge status: Home / Self Care | Payer: Self-pay | Attending: Emergency Medicine | Admitting: Emergency Medicine

## 2014-07-22 DIAGNOSIS — F17200 Nicotine dependence, unspecified, uncomplicated: Secondary | ICD-10-CM | POA: Insufficient documentation

## 2014-07-22 DIAGNOSIS — I10 Essential (primary) hypertension: Secondary | ICD-10-CM | POA: Insufficient documentation

## 2014-07-22 DIAGNOSIS — T50905A Adverse effect of unspecified drugs, medicaments and biological substances, initial encounter: Secondary | ICD-10-CM | POA: Insufficient documentation

## 2014-07-22 DIAGNOSIS — R42 Dizziness and giddiness: Secondary | ICD-10-CM | POA: Insufficient documentation

## 2014-07-22 DIAGNOSIS — F419 Anxiety disorder, unspecified: Secondary | ICD-10-CM | POA: Insufficient documentation

## 2014-07-22 LAB — CBC AND DIFFERENTIAL
Basophils Absolute Automated: 0.03 10*3/uL (ref 0.00–0.20)
Basophils Automated: 1 %
Eosinophils Absolute Automated: 0.22 10*3/uL (ref 0.00–0.70)
Eosinophils Automated: 4 %
Hematocrit: 37.1 % (ref 37.0–47.0)
Hgb: 11.9 g/dL — ABNORMAL LOW (ref 12.0–16.0)
Immature Granulocytes Absolute: 0.01 10*3/uL
Immature Granulocytes: 0 %
Lymphocytes Absolute Automated: 2.03 10*3/uL (ref 0.50–4.40)
Lymphocytes Automated: 39 %
MCH: 26.6 pg — ABNORMAL LOW (ref 28.0–32.0)
MCHC: 32.1 g/dL (ref 32.0–36.0)
MCV: 83 fL (ref 80.0–100.0)
MPV: 10.1 fL (ref 9.4–12.3)
Monocytes Absolute Automated: 0.39 10*3/uL (ref 0.00–1.20)
Monocytes: 7 %
Neutrophils Absolute: 2.59 10*3/uL (ref 1.80–8.10)
Neutrophils: 49 %
Nucleated RBC: 0 /100 WBC (ref 0–1)
Platelets: 410 10*3/uL — ABNORMAL HIGH (ref 140–400)
RBC: 4.47 10*6/uL (ref 4.20–5.40)
RDW: 16 % — ABNORMAL HIGH (ref 12–15)
WBC: 5.26 10*3/uL (ref 3.50–10.80)

## 2014-07-22 LAB — COMPREHENSIVE METABOLIC PANEL
ALT: 35 U/L (ref 0–55)
AST (SGOT): 35 U/L — ABNORMAL HIGH (ref 5–34)
Albumin/Globulin Ratio: 1.2 (ref 0.9–2.2)
Albumin: 3.8 g/dL (ref 3.5–5.0)
Alkaline Phosphatase: 61 U/L (ref 37–106)
Anion Gap: 7 (ref 5.0–15.0)
BUN: 11 mg/dL (ref 7.0–19.0)
Bilirubin, Total: 0.4 mg/dL (ref 0.2–1.2)
CO2: 24 mEq/L (ref 22–29)
Calcium: 9.6 mg/dL (ref 8.5–10.5)
Chloride: 108 mEq/L (ref 100–111)
Creatinine: 0.8 mg/dL (ref 0.6–1.0)
Globulin: 3.2 g/dL (ref 2.0–3.6)
Glucose: 108 mg/dL — ABNORMAL HIGH (ref 70–100)
Potassium: 3.8 mEq/L (ref 3.5–5.1)
Protein, Total: 7 g/dL (ref 6.0–8.3)
Sodium: 139 mEq/L (ref 136–145)

## 2014-07-22 LAB — TROPONIN I: Troponin I: 0.01 ng/mL (ref 0.00–0.09)

## 2014-07-22 LAB — HEMOLYSIS INDEX: Hemolysis Index: 5 (ref 0–18)

## 2014-07-22 LAB — GFR: EGFR: >60

## 2014-07-22 MED ORDER — ONDANSETRON HCL 4 MG/2ML IJ SOLN
4.0000 mg | Freq: Once | INTRAMUSCULAR | Status: AC
Start: 2014-07-22 — End: 2014-07-22

## 2014-07-22 MED ORDER — METOCLOPRAMIDE HCL 10 MG PO TABS
10.0000 mg | ORAL_TABLET | Freq: Four times a day (QID) | ORAL | Status: AC | PRN
Start: 2014-07-22 — End: 2014-08-01

## 2014-07-22 MED ORDER — METOCLOPRAMIDE HCL 5 MG/ML IJ SOLN
10.0000 mg | Freq: Once | INTRAMUSCULAR | Status: AC
Start: 2014-07-22 — End: 2014-07-22
  Administered 2014-07-22: 08:00:00 10 mg via INTRAVENOUS
  Filled 2014-07-22: qty 2

## 2014-07-22 MED ORDER — SODIUM CHLORIDE 0.9 % IV BOLUS
1000.0000 mL | Freq: Once | INTRAVENOUS | Status: AC
Start: 2014-07-22 — End: 2014-07-22
  Administered 2014-07-22: 1000 mL via INTRAVENOUS

## 2014-07-22 MED ORDER — DIAZEPAM 5 MG PO TABS
5.0000 mg | ORAL_TABLET | Freq: Three times a day (TID) | ORAL | Status: DC | PRN
Start: 2014-07-22 — End: 2014-08-31

## 2014-07-22 MED ORDER — ONDANSETRON HCL 4 MG/2ML IJ SOLN
INTRAMUSCULAR | Status: AC
Start: 2014-07-22 — End: 2014-07-22
  Administered 2014-07-22: 4 mg via INTRAVENOUS
  Filled 2014-07-22: qty 2

## 2014-07-22 MED ORDER — LORAZEPAM 2 MG/ML IJ SOLN
1.0000 mg | Freq: Once | INTRAMUSCULAR | Status: AC
Start: 2014-07-22 — End: 2014-07-22
  Administered 2014-07-22: 1 mg via INTRAVENOUS
  Filled 2014-07-22: qty 1

## 2014-07-22 NOTE — Discharge Instructions (Signed)
Vertigo    You have been diagnosed with vertigo.    Vertigo means "the feeling of spinning." People with vertigo have an intense feeling that the room is spinning. This is often called "dizziness."    Most of the time the cause of vertigo is not serious. The most common cause is a balance problem in the inner ear.    The usual treatment is medication to help relieve the spinning feeling and to control nausea.    DO NOT drive a motor vehicle or operate any other equipment that requires concentration until your symptoms have resolved. Be very careful going up and down stairs.    If your symptoms continue your doctor may order an MRI of your brain to make sure there is not a more serious cause of your vertigo.    YOU SHOULD SEEK MEDICAL ATTENTION IMMEDIATELY, EITHER HERE OR AT THE NEAREST EMERGENCY DEPARTMENT, IF ANY OF THE FOLLOWING OCCURS:   You feel numbness, tingling, or weakness in your arms or legs or become unable to walk.   Your symptoms become worse, even with medication.   You have a severe headache.   You have vomiting that makes it hard to take or keep down medication.              Medication Reaction    You have been seen for an unusual reaction to a medicine.    You are probably having your symptoms because of a reaction to a medicine you used. Because the effects of the medicine are now minor or almost gone, it is OK for you to go home.    Bad or unexpected reactions to drugs happen often. Almost all medicines have POSSIBLE side effects. Most of these are minor, but sometimes the effects can be more severe. Sometimes, people take many medications at the same time. Some of the medications can react badly with one another.    Medicines are normally safe when you follow the instructions. However, it may be dangerous to take even one pill more than you are supposed to. This is why it is VERY IMPORTANT to follow the instructions on your medicine bottles.    Use the following general  instructions:   ALWAYS take your medicines as prescribed.   You may forget to take medicine or miss a pill. If you do, ask your doctor or pharmacist before taking an extra pill to make up for the one you missed.   NEVER take someone else s medicine that was not prescribed to you.   If you have symptoms that may be caused by medication, tell your doctor before taking any more.    YOU SHOULD SEEK MEDICAL ATTENTION IMMEDIATELY, EITHER HERE OR AT THE NEAREST EMERGENCY DEPARTMENT, IF ANY OF THE FOLLOWING OCCURS:   You have signs of an allergic reaction: You have a rash, shortness of breath, or your lips or tongue become swollen.   You get lightheaded, dizzy or confused, or pass out after taking a medicine.   You think you might harm yourself or others or often think about suicide (killing yourself).   You are vomiting or have severe headaches.   You have any symptoms you think are severe or possibly dangerous.              Dizziness, Nonspecific    You have been seen for dizziness.     Dizziness can mean different things to different people. Some people use dizziness to mean the feeling of spinning  when there is no actual movement. This often causes nausea (feeling sick). The medical term for this is "vertigo." Others people use the word dizzy to mean "feeling lightheaded," like you might faint. This feeling is usually made better when lying down. For some people, neither of these describes how they are feeling. It can just be a feeling that makes you unsteady. This feeling is common in older people. It can be caused by a number of things. These include poor vision or hearing, foot problems and arthritis. It can also be caused by middle ear or sinus problems. The feeling can come and go.    Dizziness is also caused by more serious things. This includes strokes and heart problems.    It is NEVER normal to have the kind of dizziness you have today together with:   Chest pain.   Problems walking because of  problems with balance. Especially if you are falling to one side.   Weakness, numbness or tingling in a part of your body.   Drooping of one side of your face.   Confusion.   Severe headache.   Problems speaking.    If you have these symptoms, it is VERY IMPORTANT to go to the nearest emergency department.    Your tests today were negative (normal). This means we found no life-threatening causes for your dizziness. It is OK for you to go home.    See your primary care doctor for more work-up of your dizziness.     YOU SHOULD SEEK MEDICAL ATTENTION IMMEDIATELY, EITHER HERE OR AT THE NEAREST EMERGENCY DEPARTMENT, IF ANY OF THE FOLLOWING OCCUR:   You cannot speak clearly (slurring), one side of your face droops or you feel weak in the arms or legs (especially on one side).   You have problems with your balance.   You have problems hearing or there is ringing or a feeling of fullness in your ear.   You lose consciousness ("pass out" or faint).   You have severe headache with dizziness.   You have fever (temperature higher than 100.31F / 38C).   You fall and hit your head.

## 2014-07-22 NOTE — ED Provider Notes (Signed)
EMERGENCY DEPARTMENT HISTORY AND PHYSICAL EXAM    Date: 07/22/2014  Patient Name: Wendy Preston  Attending Physician:  Lavonda Jumbo, MD, FACEP  Diagnosis and Treatment Plan       Clinical Impression:   Final diagnoses:   Dizziness   Medication reaction, initial encounter   Anxiety   Vertigo       Treatment Plan:   ED Disposition     Discharge Wendy Preston discharge to home/self care.    Condition at disposition: Stable            History of Presenting Illness     Chief Complaint   Patient presents with   . Dizziness       History Provided By: Patient  Chief Complaint: Dizziness  Onset: This morning  Timing: Constant  Quality: "the room is spinning"  Severity: Mild-moderate  Modifying Factors: Worse with moving her head, laying down  Associated sxs: Blurred vision, nausea    Additional History: Wendy Preston is a 43 y.o. female presenting to the ED with constant dizziness which began this morning. Pt notes that the dizziness began when she woke up. She describes the dizziness as "the room is spinning". The dizziness is worsened with moving her head and laying down. She also reports nausea and intermittent blurred vision. She notes that she started taking benicar yesterday. She denies shortness of breath or focal weakness. Pt is crying and states that she is concerned about her new medication.   Pt reports hx of HTN and asthma. She takes HCTZ for her HTN. Pt is a smoker. She denies the chance of pregnancy.    PCP: No primary care provider on file.  Storm Frisk, PA [facility])    Current facility-administered medications: sodium chloride 0.9 % bolus 1,000 mL, 1,000 mL, Intravenous, Once, Trevor Wilkie, Landis Gandy, MD, Last Rate: 1,000 mL/hr at 07/22/14 0756, 1,000 mL at 07/22/14 0756  Current outpatient prescriptions: olmesartan (BENICAR) 20 MG tablet, Take 20 mg by mouth daily., Disp: , Rfl: ;  diazepam (VALIUM) 5 MG tablet, Take 1 tablet (5 mg total) by mouth every 8 (eight) hours as needed  (dizziness)., Disp: 6 tablet, Rfl: 0;  hydrochlorothiazide (HYDRODIURIL) 25 MG tablet,   , Disp: , Rfl: ;  metoclopramide (REGLAN) 10 MG tablet, Take 1 tablet (10 mg total) by mouth 4 (four) times daily as needed., Disp: 10 tablet, Rfl: 0  pantoprazole (PROTONIX) 40 MG tablet, Take 1 tablet (40 mg total) by mouth daily., Disp: 30 tablet, Rfl: 1;  PROAIR HFA 108 (90 BASE) MCG/ACT inhaler,   , Disp: , Rfl:     Past Medical History     Past Medical History   Diagnosis Date   . Asthma without status asthmaticus      Past Surgical History   Procedure Laterality Date   . Cesarean section         Family History     History reviewed. No pertinent family history.    Social History     History     Social History   . Marital Status: Single     Spouse Name: N/A     Number of Children: N/A   . Years of Education: N/A     Social History Main Topics   . Smoking status: Current Every Day Smoker   . Smokeless tobacco: Not on file   . Alcohol Use: Yes   . Drug Use: No   . Sexual Activity: Not on file  Other Topics Concern   . Not on file     Social History Narrative       Allergies     Allergies   Allergen Reactions   . Penicillins        Review of Systems     Review of Systems   Constitutional: Negative for fever.   Eyes:        + blurred vision   Respiratory: Negative for shortness of breath.    Gastrointestinal: Positive for nausea.   Allergic/Immunologic:        + allergy to penicillins   Neurological: Positive for dizziness. Negative for weakness.   All other systems reviewed and are negative.         Physical Exam     BP 187/107 mmHg  Pulse 85  Temp(Src) 98 F (36.7 C)  Resp 19  Ht 1.727 m  Wt 99.791 kg  BMI 33.46 kg/m2  SpO2 99%  Pulse Oximetry Analysis - Normal             Physical Examination: General appearance - alert, well appearing, and in no distress, morbid obesity  Mental status - alert, oriented to person, place, and time  Eyes - pupils equal and reactive, extraocular eye movements intact  Nose - normal  and patent, no discharge  Neck - grossly normal rom  Chest - clear to auscultation, no wheezes, rales or rhonchi, symmetric air entry  Heart - normal rate, regular rhythm  Abdomen - soft, nontender, nondistended  Neurological - maex4, speech clear  Musculoskeletal - no joint tenderness, deformity or swelling  Extremities - distal blood flow grossly normal, no pedal edema  Skin - normal coloration, no rashes where visualized   Psychiatric- alert, oriented,crying and anxious  Neg orthostatics                EKG:   Interpreted by EDP. Sinus rhythm. Rate is normal. Nonspecific ST/Twave changes.  Monitor: Normal sinus rhythm.            Diagnostic Study Results     Labs -     Results     Procedure Component Value Units Date/Time    Troponin I [[id number]] Collected:  07/22/14 0740    Specimen Information:  Blood Updated:  07/22/14 0815     Troponin I 0.01 ng/mL     Hemolysis index [[id number]] Collected:  07/22/14 0740     Hemolysis Index 5 Updated:  07/22/14 0810    GFR [[id number]] Collected:  07/22/14 0740     EGFR >60.0 Updated:  07/22/14 0810    Comprehensive metabolic panel [[id number]]  (Abnormal) Collected:  07/22/14 0740    Specimen Information:  Blood Updated:  07/22/14 0810     Glucose 108 (H) mg/dL      BUN 02.7 mg/dL      Creatinine 0.8 mg/dL      Sodium 253 mEq/L      Potassium 3.8 mEq/L      Chloride 108 mEq/L      CO2 24 mEq/L      CALCIUM 9.6 mg/dL      Protein, Total 7.0 g/dL      Albumin 3.8 g/dL      AST (SGOT) 35 (H) U/L      ALT 35 U/L      Alkaline Phosphatase 61 U/L      Bilirubin, Total 0.4 mg/dL      Globulin 3.2 g/dL      Albumin/Globulin Ratio 1.2  Anion Gap 7.0     CBC and differential [[id number]]  (Abnormal) Collected:  07/22/14 0740    Specimen Information:  Blood / Blood Updated:  07/22/14 0757     WBC 5.26 x10 3/uL      RBC 4.47 x10 6/uL      Hgb 11.9 (L) g/dL      Hematocrit 28.4 %      MCV 83.0 fL      MCH 26.6 (L) pg      MCHC 32.1 g/dL      RDW 16 (H) %      Platelets 410 (H) x10 3/uL       MPV 10.1 fL      Neutrophils 49 %      Lymphocytes Automated 39 %      Monocytes 7 %      Eosinophils Automated 4 %      Basophils Automated 1 %      Immature Granulocyte 0 %      Nucleated RBC 0 /100 WBC      Neutrophils Absolute 2.59 x10 3/uL      Abs Lymph Automated 2.03 x10 3/uL      Abs Mono Automated 0.39 x10 3/uL      Abs Eos Automated 0.22 x10 3/uL      Absolute Baso Automated 0.03 x10 3/uL      Absolute Immature Granulocyte 0.01 x10 3/uL           Radiologic Studies -   Radiology Results (24 Hour)     Procedure Component Value Units Date/Time    XR Chest AP Portable [[id number]] Collected:  07/22/14 7253    Order Status:  Completed Updated:  07/22/14 0811    Narrative:      HISTORY: Weakness    .    TECHNIQUE: Single frontal view of the chest was obtained.     PRIORS: 02/27/2014.    FINDINGS: The lung fields are clear. There are no pleural effusions. The  cardiac silhouette and hila are normal. The trachea is midline. The bony  structures are essentially unremarkable.       Impression:       No active lung disease.    Georgana Curio, MD   07/22/2014 8:07 AM      CT Head WO Contrast [[id number]] Collected:  07/22/14 0801    Order Status:  Completed Updated:  07/22/14 0805    Narrative:      HISTORY: Acute blurred vision. Dizziness    TECHNIQUE:  Non enhanced Computed tomography the head was performed at 5  mm slice thickness.     PRIORS: 06/04/2010.    FINDINGS:       The ventricular system is normal in size, shape and contour. There  is no midline shift or herniation. No acute intracranial hemorrhage is  seen. There are no extra-axial fluid collections.   The visualized portions of paranasal sinuses are within normal limits.  The bony structures are unremarkable.      Impression:       No acute intracranial abnormality.      Georgana Curio, MD   07/22/2014 8:01 AM        .    [facility] Notes     ED Course:  8:42 AM - Discussed pt case with Storm Frisk, PA (pt's PMD), who agrees with plan for  discontinuing HCTZ and benicar and discharge and pt can follow up in the office.    8:45 AM -  Pt is feeling better and would like to go home. Discussed results, plan to follow up with her PMD in 1 day and to discontinue taking benicar and HCTZ. Pt was counseled on signs and symptoms for when to return to the ED. She agrees with plan. Pt is stable and ready for discharge.                 _______________________________  Medical DeMedical Decision Makingcision Making  Attestations:     Physician/Midlevel provider first contact with patient: 07/22/14 0714         This note is prepared for Lavonda Jumbo, MD, FACEP. The scribe's documentation has been prepared under my direction and personally reviewed by me in its entirety.  I confirm that the note above accurately reflects all work, treatment, procedures, and medical decision making performed by me.     I am the first provider for this patient.      Lavonda Jumbo, MD, FACEP is the primary emergency doctor of record.      I reviewed the available vital signs, nursing notes, past medical history, past surgical history, family history and social history at the time of initial evaluation.    _______________________________              Donny Pique, MD  07/22/14 [phone]

## 2014-07-22 NOTE — ED Notes (Signed)
Pt reports starting new medication yesterday (Benicar).  Pt reports that at 0520 she woke with severe dizziness and blurred vision.  Reports the room was spinning.  Reports nausea; Denies CP, SOB

## 2014-07-24 LAB — ECG 12-LEAD
Atrial Rate: 82 {beats}/min
P Axis: 64 degrees
P-R Interval: 150 ms
Q-T Interval: 392 ms
QRS Duration: 90 ms
QTC Calculation (Bezet): 457 ms
R Axis: 14 degrees
T Axis: 19 degrees
Ventricular Rate: 82 {beats}/min

## 2014-08-30 DIAGNOSIS — X58XXXA Exposure to other specified factors, initial encounter: Secondary | ICD-10-CM | POA: Insufficient documentation

## 2014-08-30 DIAGNOSIS — I10 Essential (primary) hypertension: Secondary | ICD-10-CM | POA: Insufficient documentation

## 2014-08-30 DIAGNOSIS — S143XXA Injury of brachial plexus, initial encounter: Secondary | ICD-10-CM | POA: Insufficient documentation

## 2014-08-30 DIAGNOSIS — Y9389 Activity, other specified: Secondary | ICD-10-CM | POA: Insufficient documentation

## 2014-08-30 DIAGNOSIS — J45909 Unspecified asthma, uncomplicated: Secondary | ICD-10-CM | POA: Insufficient documentation

## 2014-08-30 DIAGNOSIS — M25511 Pain in right shoulder: Secondary | ICD-10-CM | POA: Insufficient documentation

## 2014-08-31 ENCOUNTER — Emergency Department: Payer: Self-pay

## 2014-08-31 ENCOUNTER — Emergency Department
Admission: EM | Admit: 2014-08-31 | Discharge: 2014-08-31 | Disposition: A | Discharge status: Home / Self Care | Payer: Self-pay | Attending: Emergency Medicine | Admitting: Emergency Medicine

## 2014-08-31 DIAGNOSIS — S143XXA Injury of brachial plexus, initial encounter: Secondary | ICD-10-CM

## 2014-08-31 DIAGNOSIS — M25511 Pain in right shoulder: Secondary | ICD-10-CM

## 2014-08-31 MED ORDER — HYDROCODONE-ACETAMINOPHEN 5-325 MG PO TABS
1.0000 | ORAL_TABLET | Freq: Four times a day (QID) | ORAL | Status: DC | PRN
Start: 2014-08-31 — End: 2015-01-25

## 2014-08-31 MED ORDER — NAPROXEN 250 MG PO TABS
500.0000 mg | ORAL_TABLET | Freq: Two times a day (BID) | ORAL | Status: DC
Start: 2014-08-31 — End: 2015-01-25

## 2014-08-31 MED ORDER — MORPHINE SULFATE 2 MG/ML IJ/IV SOLN (WRAP)
4.0000 mg | Freq: Once | Status: AC
Start: 2014-08-31 — End: 2014-08-31
  Administered 2014-08-31: 4 mg via INTRAMUSCULAR
  Filled 2014-08-31: qty 2

## 2014-08-31 NOTE — Discharge Instructions (Signed)
Thank you for trusting [place] with your care today, and thank you for your patience while you have been here in the emergency department.  Please do not hesitate to call or return if you have any questions or concerns, or for any change in status. Please take the pain medications as prescribed and do not drive while taking Norco.      Joint Pain    You have been seen for joint pain.    Many things can cause pain in your joints. Having joint pain means that you often have inflammation in your joints.     Some things that can cause joint pain are:   Rheumatoid arthritis (inflammation of the joints).   Trauma or injury to your joints.   A viral infection.   Overuse of your joints.   Obesity that causes excess wear and tear on your joints.   Gout (swelling of the joints).   Osteoarthritis (breakdown of cartilage in the joints).   Inflammation of a joint tendon (tendinitis).    It is not yet known why you are having joint pains. You might need another exam or more tests to find out why you have these symptoms. At this time, the cause of your symptoms does not seem dangerous. You don t need to stay in the hospital.    Some things you can try at home are:   Rest the affected joint.   Frequent stretching.   Massage.   NSAID medications like ibuprofen (Advil or Motrin), naproxen (Aleve, Naprosyn).   Elevating the joints that hurt.    Follow the instructions for any medication you are prescribed.     Follow up with your doctor in 7 days.    We don't believe your condition is dangerous right now. However, you need to be careful. Sometimes a problem that seems small can get serious later. Therefore, it is very important for you to come back here or go to the nearest Emergency Department if you don t get better or your symptoms get worse.    YOU SHOULD SEEK MEDICAL ATTENTION IMMEDIATELY, EITHER HERE OR AT THE NEAREST EMERGENCY DEPARTMENT, IF ANY OF THE FOLLOWING OCCUR:     You have a fever (temperature  higher than 100.15F or 38C).   Your pain does not go away or gets worse.   You notice redness over the joint.   Your painful joint gets very swollen.   You don t feel better after treatment.   Any other symptoms, concerns, or not getting better as expected.    If you can t follow up with your doctor, or if at any time you feel you need to be rechecked or seen again, come back here or go to the nearest emergency department.
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
## 2014-08-31 NOTE — ED Notes (Signed)
Wendy Preston is a 43 y.o. female who complains of right arm pain beginning yesterday. Denies injury. PMS intact.

## 2015-01-25 ENCOUNTER — Emergency Department: Payer: Self-pay

## 2015-01-25 ENCOUNTER — Emergency Department
Admission: EM | Admit: 2015-01-25 | Discharge: 2015-01-25 | Disposition: A | Discharge status: Home / Self Care | Payer: Self-pay | Attending: Emergency Medicine | Admitting: Emergency Medicine

## 2015-01-25 DIAGNOSIS — I10 Essential (primary) hypertension: Secondary | ICD-10-CM | POA: Insufficient documentation

## 2015-01-25 DIAGNOSIS — R110 Nausea: Secondary | ICD-10-CM | POA: Insufficient documentation

## 2015-01-25 DIAGNOSIS — J45909 Unspecified asthma, uncomplicated: Secondary | ICD-10-CM | POA: Insufficient documentation

## 2015-01-25 DIAGNOSIS — F17210 Nicotine dependence, cigarettes, uncomplicated: Secondary | ICD-10-CM | POA: Insufficient documentation

## 2015-01-25 DIAGNOSIS — R42 Dizziness and giddiness: Secondary | ICD-10-CM | POA: Insufficient documentation

## 2015-01-25 LAB — CBC AND DIFFERENTIAL
Basophils Absolute Automated: 0.02 10*3/uL (ref 0.00–0.20)
Basophils Automated: 0 %
Eosinophils Absolute Automated: 0.24 10*3/uL (ref 0.00–0.70)
Eosinophils Automated: 4 %
Hematocrit: 34.9 % — ABNORMAL LOW (ref 37.0–47.0)
Hgb: 11.5 g/dL — ABNORMAL LOW (ref 12.0–16.0)
Immature Granulocytes Absolute: 0.01 10*3/uL
Immature Granulocytes: 0 %
Lymphocytes Absolute Automated: 3.28 10*3/uL (ref 0.50–4.40)
Lymphocytes Automated: 48 %
MCH: 26.3 pg — ABNORMAL LOW (ref 28.0–32.0)
MCHC: 33 g/dL (ref 32.0–36.0)
MCV: 79.7 fL — ABNORMAL LOW (ref 80.0–100.0)
MPV: 9.8 fL (ref 9.4–12.3)
Monocytes Absolute Automated: 0.31 10*3/uL (ref 0.00–1.20)
Monocytes: 4 %
Neutrophils Absolute: 3.03 10*3/uL (ref 1.80–8.10)
Neutrophils: 44 %
Nucleated RBC: 1 /100 WBC (ref 0–1)
Platelets: 368 10*3/uL (ref 140–400)
RBC: 4.38 10*6/uL (ref 4.20–5.40)
RDW: 16 % — ABNORMAL HIGH (ref 12–15)
WBC: 6.88 10*3/uL (ref 3.50–10.80)

## 2015-01-25 LAB — BASIC METABOLIC PANEL
Anion Gap: 8 (ref 5.0–15.0)
BUN: 8 mg/dL (ref 7.0–19.0)
CO2: 21 mEq/L — ABNORMAL LOW (ref 22–29)
Calcium: 9.5 mg/dL (ref 8.5–10.5)
Chloride: 109 mEq/L (ref 100–111)
Creatinine: 0.8 mg/dL (ref 0.6–1.0)
Glucose: 90 mg/dL (ref 70–100)
Potassium: 4 mEq/L (ref 3.5–5.1)
Sodium: 138 mEq/L (ref 136–145)

## 2015-01-25 LAB — GFR: EGFR: >60

## 2015-01-25 LAB — HEMOLYSIS INDEX: Hemolysis Index: 4 (ref 0–18)

## 2015-01-25 MED ORDER — ONDANSETRON 4 MG PO TBDP
4.0000 mg | ORAL_TABLET | Freq: Four times a day (QID) | ORAL | Status: DC | PRN
Start: 2015-01-25 — End: 2016-02-02

## 2015-01-25 MED ORDER — MECLIZINE HCL 25 MG PO TABS
25.0000 mg | ORAL_TABLET | Freq: Four times a day (QID) | ORAL | Status: DC | PRN
Start: 2015-01-25 — End: 2016-02-02

## 2015-01-25 MED ORDER — ONDANSETRON 4 MG PO TBDP
4.0000 mg | ORAL_TABLET | Freq: Once | ORAL | Status: AC
Start: 2015-01-25 — End: 2015-01-25
  Administered 2015-01-25: 4 mg via ORAL
  Filled 2015-01-25: qty 1

## 2015-01-25 MED ORDER — MECLIZINE HCL 12.5 MG PO TABS
50.0000 mg | ORAL_TABLET | Freq: Once | ORAL | Status: AC
Start: 2015-01-25 — End: 2015-01-25
  Administered 2015-01-25: 50 mg via ORAL
  Filled 2015-01-25: qty 4

## 2015-01-25 NOTE — ED Provider Notes (Signed)
EMERGENCY DEPARTMENT HISTORY AND PHYSICAL EXAM    Date: 01/25/2015   Physician/Midlevel provider first contact with patient: 01/25/15 1912       Patient Name: Wendy Preston  Attending Physician: Sharyne Richters, *  Mid-level: Tula Nakayama, PA-C      History of Presenting Illness     Chief Complaint   Patient presents with   . Dizziness       History Provided By: patient    Chief Complaint: dizziness     Wendy Preston is a 44 y.o. female complaining of 3 day hx of dizziness, worse when turning her head or bending over.  Pt states she feels nauseated from the sx, but denies any vomiting.  Pt did have nasal congestion last week which has not completely resolved.  Pt stopped taking her HCTZ 3 days ago due to the sx; pt had hx of reaction to her HTN medications 06/2014 and had stopped taking it a short time.  Pt states she has now been taking HCTZ for approx 5 months.  Pt denies any HA, fevers, chills, weakness, numbness, tingling, loss of function, neck or back pain.    PCP: Pcp, Noneorunknown, MD    No current facility-administered medications for this encounter.     Current Outpatient Prescriptions   Medication Sig Dispense Refill   . meclizine (ANTIVERT) 25 MG tablet Take 1 tablet (25 mg total) by mouth every 6 (six) hours as needed for Dizziness. 30 tablet 0   . ondansetron (ZOFRAN ODT) 4 MG disintegrating tablet Take 1 tablet (4 mg total) by mouth every 6 (six) hours as needed for Nausea. 30 tablet 1   . PROAIR HFA 108 (90 BASE) MCG/ACT inhaler          Past Medical History     Past Medical History   Diagnosis Date   . Asthma without status asthmaticus      Past Surgical History   Procedure Laterality Date   . Cesarean section         Family History     No family history on file.    Social History     History     Social History   . Marital Status: Single     Spouse Name: N/A   . Number of Children: N/A   . Years of Education: N/A     Social History Main Topics   . Smoking status: Current Every Day Smoker   .  Smokeless tobacco: Not on file   . Alcohol Use: Yes   . Drug Use: No   . Sexual Activity: Not on file     Other Topics Concern   . Not on file     Social History Narrative         Allergies     Allergies   Allergen Reactions   . Penicillins        Review of Systems     Review of Systems   Constitutional: Negative for fever and chills.   HENT: Positive for congestion. Negative for sore throat.    Respiratory: Negative for cough, shortness of breath and stridor.    Cardiovascular: Negative for chest pain and leg swelling.   Gastrointestinal: Positive for nausea. Negative for vomiting, abdominal pain and diarrhea.   Genitourinary: Negative for dysuria.   Musculoskeletal: Negative for myalgias, back pain, joint pain, falls and neck pain.   Skin: Negative for rash.   Neurological: Positive for dizziness. Negative for tingling, sensory change,  focal weakness, weakness and headaches.   Endo/Heme/Allergies: Negative for polydipsia. Does not bruise/bleed easily.   All other systems reviewed and are negative.          Physical Exam   BP 151/90 mmHg  Pulse 88  Temp(Src) 99.9 F (37.7 C)  Resp 16  Ht 5\' 8"  (1.727 m)  Wt 105.235 kg  BMI 35.28 kg/m2  SpO2 98%  LMP 01/02/2015    CONSTITUTIONAL:  Oriented to person, place, and time.  Well-developed and well-nourished.  No acute distress. Non-toxic appearance.  Vital signs reviewed.  HEAD:  Normocephalic and atraumatic.  EYES:  Bilateral pupils equal and reactive.  No injection, drainage, swelling, or erythema.  RIGHT EAR:  No lacerations. No erythema, drainage, swelling, or tenderness. No foreign bodies. No mastoid tenderness. Tympanic membrane is mildly bulging.  TM is not injected, not scarred, not perforated, not erythematous, and not retracted.  No hemotympanum.   LEFT EAR:   No lacerations. No erythema, drainage, swelling, or tenderness. No foreign bodies. No mastoid tenderness. Tympanic membrane is not injected, not scarred, not perforated, not erythematous, not  retracted, and not bulging. No hemotympanum.   NOSE:  Mild crusting.  No mucosal edema, rhinorrhea, nose lacerations, nasal deformity, septal deviation, or nasal septal hematoma. No epistaxis.  No foreign bodies. No maxillary or frontal sinus tenderness.  MOUTH/THROAT:  Oropharynx is clear and moist and mucous membranes are normal.  No erythema, tonsillar exudates, masses, or lesions.  NECK:  Full active and passive range of motion without pain.  Neck supple. No spinous process tenderness and no muscular tenderness present.  No rigidity.  No lymphadenopathy.  No meningeal signs.  RESP:  No respiratory distress.  Breath sounds with no rales, wheezing, or rhonchi. No chest wall tenderness.  CARDIAC:  Regular rate and rhythm.  Heart sounds with no murmurs, gallop, or friction rub.  ABD:  Normal bowel sounds x4 quadrants.  Soft.  Nontender.  Nondistended.  No organomegaly.  No peritoneal signs.  No signs of trauma or contusions.  No palpable pulsatile masses.  BACK:  No CVA tenderness.  Full range of motion without pain.  No spinous process tenderness, muscular tenderness, or spasm present.  MS:  No lacerations, contusions, or deformity.  Full active and passive range of motion. 2+ pulses.  No edema or calf tenderness.   SKIN:  Skin is warm and dry. No rashes, lesions, contusions, or drainage.  NEURO:  A0X3.  CN II-XII grossly intact.  5/5 strength in bilateral upper and lower extremities. 3+ DTRs.  Sensory intact throughout.  Slowed gait, but steady.  Standing reveals vertiginous activity, favoring toward the Rt   PSYCH:  Mood, memory, affect, and judgment normal for age.      Diagnostic Study Results     Labs -     Results     Procedure Component Value Units Date/Time    Basic Metabolic Panel (BMP) [[id number]]  (Abnormal) Collected:  01/25/15 2048    Specimen Information:  Blood Updated:  01/25/15 2157     Glucose 90 mg/dL      BUN 8.0 mg/dL      Creatinine 0.8 mg/dL      Calcium 9.5 mg/dL      Sodium 413 mEq/L       Potassium 4.0 mEq/L      Chloride 109 mEq/L      CO2 21 (L) mEq/L      Anion Gap 8.0     Hemolysis index [[id number]] Collected:  01/25/15 2048     Hemolysis Index 4 Updated:  01/25/15 2157    GFR [[id number]] Collected:  01/25/15 2048     EGFR >60.0 Updated:  01/25/15 2157    CBC and differential [[id number]]  (Abnormal) Collected:  01/25/15 2048    Specimen Information:  Blood from Blood Updated:  01/25/15 2153     WBC 6.88 x10 3/uL      Hgb 11.5 (L) g/dL      Hematocrit 78.2 (L) %      Platelets 368 x10 3/uL      RBC 4.38 x10 6/uL      MCV 79.7 (L) fL      MCH 26.3 (L) pg      MCHC 33.0 g/dL      RDW 16 (H) %      MPV 9.8 fL      Neutrophils 44 %      Lymphocytes Automated 48 %      Monocytes 4 %      Eosinophils Automated 4 %      Basophils Automated 0 %      Immature Granulocyte 0 %      Nucleated RBC 1 /100 WBC      Neutrophils Absolute 3.03 x10 3/uL      Abs Lymph Automated 3.28 x10 3/uL      Abs Mono Automated 0.31 x10 3/uL      Abs Eos Automated 0.24 x10 3/uL      Absolute Baso Automated 0.02 x10 3/uL      Absolute Immature Granulocyte 0.01 x10 3/uL     CBC and differential [[id number]] Collected:  01/25/15 1935    Specimen Information:  Blood from Blood Updated:  01/25/15 1935    Basic Metabolic Panel (BMP) [[id number]] Collected:  01/25/15 1935    Specimen Information:  Blood Updated:  01/25/15 1935          Radiologic Studies -   Radiology Results (24 Hour)     ** No results found for the last 24 hours. **          Clinical Course in the Emergency Department     7:30 PM  Labs, Antivert 50mg  PO, and Zofran 4mg  ODT PO ordered.    9:03 PM  Reviewed with pt and family pending labs.  Pt feeling better at this time without dizziness or nausea.    10:20 PM  Reviewed workup with pt.  Reviewed case with Dr Fran Lowes.  Pt continues to feel well without complaint.  Will discharge pt home and refer to neuro for re-eval.  Antivert 25mg  #30 and Zofran 4mg  ODT #30 prn.  Rest, relax, and push po.  Return for worse dizziness,  MS or neuro changes, fevers, v/d, or problems.      Medical Decision Making     Differential diagnosis considered in this case:  Vertigo, electrolyte imbalance      I reviewed the vital signs, available nursing notes, past medical history, past surgical history, family history and social history.    Vital Signs-Reviewed the patient's vital signs.     Patient Vitals for the past 12 hrs:   BP Temp Pulse Resp   01/25/15 1901 151/90 mmHg 99.9 F (37.7 C) 88 16         Diagnosis and Treatment Plan       Clinical Impression:   1. Vertigo            Attending's Note (MLP) Attestestation  Tula Nakayama, [place]  01/25/15 2221    Sharyne Richters, MD  01/25/15 [phone]

## 2015-01-25 NOTE — ED Notes (Addendum)
Pt resting comfortably in bed with family member at bedside while watching TV, on her phone and does not appear to be in distress. Had family member come to nurse's station to request a blanket for herself and for the pt. Informed the pt that I will hold on providing her with a blanket for now because her temperature is elevated and I don't want to induce a fever. Pt did not respond and continued staring at her phone. Provided family member with blanket. Primary RN notified.

## 2015-01-25 NOTE — Discharge Instructions (Signed)
Vertigo    You have been diagnosed with vertigo.    Vertigo means "the feeling of spinning." People with vertigo have an intense feeling that the room is spinning. This is often called "dizziness."    Most of the time the cause of vertigo is not serious. The most common cause is a balance problem in the inner ear.    The usual treatment is medication to help relieve the spinning feeling and to control nausea.    DO NOT drive a motor vehicle or operate any other equipment that requires concentration until your symptoms have resolved. Be very careful going up and down stairs.    If your symptoms continue your doctor may order an MRI of your brain to make sure there is not a more serious cause of your vertigo.    YOU SHOULD SEEK MEDICAL ATTENTION IMMEDIATELY, EITHER HERE OR AT THE NEAREST EMERGENCY DEPARTMENT, IF ANY OF THE FOLLOWING OCCURS:   You feel numbness, tingling, or weakness in your arms or legs or become unable to walk.   Your symptoms become worse, even with medication.   You have a severe headache.   You have vomiting that makes it hard to take or keep down medication.

## 2015-01-25 NOTE — ED Notes (Signed)
Dizzy for 3 days, denies vomiting. Headache at present.

## 2015-03-23 ENCOUNTER — Emergency Department
Admission: EM | Admit: 2015-03-23 | Discharge: 2015-03-23 | Disposition: A | Discharge status: Home / Self Care | Payer: Self-pay | Attending: Emergency Medicine | Admitting: Emergency Medicine

## 2015-03-23 ENCOUNTER — Emergency Department: Payer: Self-pay

## 2015-03-23 DIAGNOSIS — R0602 Shortness of breath: Secondary | ICD-10-CM | POA: Insufficient documentation

## 2015-03-23 DIAGNOSIS — R079 Chest pain, unspecified: Secondary | ICD-10-CM | POA: Insufficient documentation

## 2015-03-23 DIAGNOSIS — F17210 Nicotine dependence, cigarettes, uncomplicated: Secondary | ICD-10-CM | POA: Insufficient documentation

## 2015-03-23 DIAGNOSIS — I10 Essential (primary) hypertension: Secondary | ICD-10-CM | POA: Insufficient documentation

## 2015-03-23 DIAGNOSIS — J4540 Moderate persistent asthma, uncomplicated: Secondary | ICD-10-CM | POA: Insufficient documentation

## 2015-03-23 LAB — CBC AND DIFFERENTIAL
Basophils Absolute Automated: 0.03 10*3/uL (ref 0.00–0.20)
Basophils Automated: 0 %
Eosinophils Absolute Automated: 0.3 10*3/uL (ref 0.00–0.70)
Eosinophils Automated: 5 %
Hematocrit: 35.6 % — ABNORMAL LOW (ref 37.0–47.0)
Hgb: 11.5 g/dL — ABNORMAL LOW (ref 12.0–16.0)
Immature Granulocytes Absolute: 0.01 10*3/uL
Immature Granulocytes: 0 %
Lymphocytes Absolute Automated: 2.06 10*3/uL (ref 0.50–4.40)
Lymphocytes Automated: 32 %
MCH: 25.9 pg — ABNORMAL LOW (ref 28.0–32.0)
MCHC: 32.3 g/dL (ref 32.0–36.0)
MCV: 80.2 fL (ref 80.0–100.0)
MPV: 9.9 fL (ref 9.4–12.3)
Monocytes Absolute Automated: 0.49 10*3/uL (ref 0.00–1.20)
Monocytes: 8 %
Neutrophils Absolute: 3.55 10*3/uL (ref 1.80–8.10)
Neutrophils: 55 %
Nucleated RBC: 0 /100 WBC (ref 0–1)
Platelets: 345 10*3/uL (ref 140–400)
RBC: 4.44 10*6/uL (ref 4.20–5.40)
RDW: 17 % — ABNORMAL HIGH (ref 12–15)
WBC: 6.43 10*3/uL (ref 3.50–10.80)

## 2015-03-23 LAB — COMPREHENSIVE METABOLIC PANEL
ALT: 18 U/L (ref 0–55)
AST (SGOT): 22 U/L (ref 5–34)
Albumin/Globulin Ratio: 0.9 (ref 0.9–2.2)
Albumin: 3.6 g/dL (ref 3.5–5.0)
Alkaline Phosphatase: 70 U/L (ref 37–106)
Anion Gap: 9 (ref 5.0–15.0)
BUN: 10 mg/dL (ref 7.0–19.0)
Bilirubin, Total: 0.4 mg/dL (ref 0.2–1.2)
CO2: 21 mEq/L — ABNORMAL LOW (ref 22–29)
Calcium: 9.1 mg/dL (ref 8.5–10.5)
Chloride: 110 mEq/L (ref 100–111)
Creatinine: 0.9 mg/dL (ref 0.6–1.0)
Globulin: 3.8 g/dL — ABNORMAL HIGH (ref 2.0–3.6)
Glucose: 93 mg/dL (ref 70–100)
Potassium: 3.9 mEq/L (ref 3.5–5.1)
Protein, Total: 7.4 g/dL (ref 6.0–8.3)
Sodium: 140 mEq/L (ref 136–145)

## 2015-03-23 LAB — TROPONIN I: Troponin I: <0.01 ng/mL (ref 0.00–0.09)

## 2015-03-23 LAB — GFR: EGFR: >60

## 2015-03-23 LAB — HEMOLYSIS INDEX: Hemolysis Index: 4 (ref 0–18)

## 2015-03-23 MED ORDER — KETOROLAC TROMETHAMINE 30 MG/ML IJ SOLN
30.0000 mg | Freq: Once | INTRAMUSCULAR | Status: AC
Start: 2015-03-23 — End: 2015-03-23
  Administered 2015-03-23: 30 mg via INTRAVENOUS
  Filled 2015-03-23: qty 1

## 2015-03-23 MED ORDER — METHYLPREDNISOLONE SODIUM SUCC 125 MG IJ SOLR
125.0000 mg | Freq: Once | INTRAMUSCULAR | Status: AC
Start: 2015-03-23 — End: 2015-03-23
  Administered 2015-03-23: 125 mg via INTRAVENOUS
  Filled 2015-03-23: qty 2

## 2015-03-23 MED ORDER — MAGNESIUM SULFATE IN D5W 10-5 MG/ML-% IV SOLN
1.0000 g | Freq: Once | INTRAVENOUS | Status: AC
Start: 2015-03-23 — End: 2015-03-23
  Administered 2015-03-23: 1 g via INTRAVENOUS
  Filled 2015-03-23: qty 100

## 2015-03-23 MED ORDER — PREDNISONE 50 MG PO TABS
50.0000 mg | ORAL_TABLET | Freq: Every day | ORAL | Status: AC
Start: 2015-03-23 — End: 2015-03-27

## 2015-03-23 MED ORDER — METHYLPREDNISOLONE SODIUM SUCC 125 MG IJ SOLR
INTRAMUSCULAR | Status: DC
Start: 2015-03-23 — End: 2015-03-23
  Filled 2015-03-23: qty 2

## 2015-03-23 MED ORDER — ONDANSETRON HCL 4 MG/2ML IJ SOLN
INTRAMUSCULAR | Status: AC
Start: 2015-03-23 — End: 2015-03-23
  Filled 2015-03-23: qty 2

## 2015-03-23 MED ORDER — ALBUTEROL-IPRATROPIUM 2.5-0.5 (3) MG/3ML IN SOLN
3.0000 mL | Freq: Four times a day (QID) | RESPIRATORY_TRACT | Status: DC
Start: 2015-03-23 — End: 2015-03-23
  Administered 2015-03-23: 3 mL via RESPIRATORY_TRACT
  Filled 2015-03-23 (×2): qty 3

## 2015-03-23 MED ORDER — IPRATROPIUM BROMIDE 0.02 % IN SOLN
0.5000 mg | Freq: Once | RESPIRATORY_TRACT | Status: AC
Start: 2015-03-23 — End: 2015-03-23
  Administered 2015-03-23: 0.5 mg via RESPIRATORY_TRACT
  Filled 2015-03-23: qty 2.5

## 2015-03-23 MED ORDER — ALBUTEROL SULFATE (2.5 MG/3ML) 0.083% IN NEBU
2.5000 mg | INHALATION_SOLUTION | RESPIRATORY_TRACT | Status: DC | PRN
Start: 2015-03-23 — End: 2015-03-23

## 2015-03-23 MED ORDER — ALBUTEROL SULFATE HFA 108 (90 BASE) MCG/ACT IN AERS
2.0000 | INHALATION_SPRAY | RESPIRATORY_TRACT | Status: AC | PRN
Start: 2015-03-23 — End: 2015-04-22

## 2015-03-23 MED ORDER — MAGNESIUM SULFATE IN D5W 10-5 MG/ML-% IV SOLN
1.0000 g | Freq: Once | INTRAVENOUS | Status: AC
Start: 2015-03-23 — End: 2015-03-23
  Administered 2015-03-23: 1 g via INTRAVENOUS
  Filled 2015-03-23: qty 200

## 2015-03-23 MED ORDER — TRAMADOL HCL 50 MG PO TABS
50.0000 mg | ORAL_TABLET | Freq: Four times a day (QID) | ORAL | Status: DC | PRN
Start: 2015-03-23 — End: 2016-02-02

## 2015-03-23 MED ORDER — ALBUTEROL SULFATE (2.5 MG/3ML) 0.083% IN NEBU
5.0000 mg | INHALATION_SOLUTION | Freq: Once | RESPIRATORY_TRACT | Status: AC
Start: 2015-03-23 — End: 2015-03-23
  Administered 2015-03-23: 5 mg via RESPIRATORY_TRACT
  Filled 2015-03-23: qty 6

## 2015-03-23 NOTE — ED Provider Notes (Signed)
EMERGENCY DEPARTMENT HISTORY AND PHYSICAL EXAM     Physician/Midlevel provider first contact with patient: 03/23/15 1723         Date: 03/23/2015  Patient Name: Wendy Preston    History of Presenting Illness     Chief Complaint   Patient presents with   . Shortness of Breath       History Provided By: Patient  Chief Complaint: SOB  Onset: 2 days ago  Timing: Constant  Severity: Moderate  Modifying Factors: Improvement with Albuterol  Associated Symptoms: Cough, cp and fever    Additional History: Wendy Preston is a 44 y.o. female presenting to the ED with 2 days of constant SOB. Pt confirms associated symptoms of cough, cp, and intermittent subjective fever. Pt denies vomiting, diarrhea and abnormal urinary symptoms. Pt reports she has a hx of asthma that she has been hospitalized in the past for. Pt reports she is taking Albuterol for her asthma. Pt also reports a hx of HTN which she regularly takes her medications for. Pt has an allergy to penicillins.     PCP: Pcp, Noneorunknown, MD      No current facility-administered medications for this encounter.     Current Outpatient Prescriptions   Medication Sig Dispense Refill   . amLODIPine (NORVASC) 5 MG tablet Take 5 mg by mouth daily.     Marland Kitchen albuterol (PROVENTIL HFA;VENTOLIN HFA) 108 (90 BASE) MCG/ACT inhaler Inhale 2 puffs into the lungs every 4 (four) hours as needed for Wheezing or Shortness of Breath (coughing). Dispense with spacer 1 Inhaler 0   . meclizine (ANTIVERT) 25 MG tablet Take 1 tablet (25 mg total) by mouth every 6 (six) hours as needed for Dizziness. 30 tablet 0   . ondansetron (ZOFRAN ODT) 4 MG disintegrating tablet Take 1 tablet (4 mg total) by mouth every 6 (six) hours as needed for Nausea. 30 tablet 1   . traMADol (ULTRAM) 50 MG tablet Take 1 tablet (50 mg total) by mouth every 6 (six) hours as needed for Pain. Do not drive or operate machinery while taking this medication 30 tablet 0       Past History     Past Medical History:  Past Medical  History   Diagnosis Date   . Asthma without status asthmaticus        Past Surgical History:  Past Surgical History   Procedure Laterality Date   . Cesarean section         Family History:  No family history on file.    Social History:  Social History   Substance Use Topics   . Smoking status: Current Every Day Smoker   . Smokeless tobacco: None   . Alcohol Use: Yes       Allergies:  Allergies   Allergen Reactions   . Penicillins        Review of Systems     Review of Systems   Constitutional: Positive for fever.   HENT: Negative for nosebleeds.    Eyes: Negative for discharge.   Respiratory: Positive for cough.    Cardiovascular: Positive for chest pain.   Gastrointestinal: Negative for vomiting and diarrhea.   Genitourinary: Negative for dysuria, urgency and frequency.   Musculoskeletal: Negative for gait problem.   Skin: Negative for rash.   Allergic/Immunologic:        +Allergy to Penicillins   Psychiatric/Behavioral: Negative for suicidal ideas.          Physical Exam   BP 130/89  mmHg  Pulse 80  Temp(Src) 98.7 F (37.1 C) (Oral)  Resp 16  Ht 5\' 9"  (1.753 m)  Wt 102.83 kg  BMI 33.46 kg/m2  SpO2 100%  LMP 03/21/2015    Physical Exam   Constitutional: Patient is oriented to person, place, and time and well-developed, well-nourished, and in no distress.   Head: Normocephalic and atraumatic.   Eyes: EOM are normal. Pupils are equal, round, and reactive to light.   Neck: Normal range of motion. Neck supple.   Cardiovascular: Normal rate and regular rhythm.   Pulmonary/Chest: Significant wheezing and rhonchi bilaterally.  No retractions.   Abdominal: Soft. There is no tenderness. Bowel sounds present and normal.  Musculoskeletal: Normal range of motion.   Neurological: Patient is alert and oriented to person, place, and time. GCS score is 15.   Skin: Skin is warm and dry.       Diagnostic Study Results     Labs -     Results     Procedure Component Value Units Date/Time    Troponin I [[id number]] Collected:   03/23/15 1558    Specimen Information:  Blood Updated:  03/23/15 1627     Troponin I <0.01 ng/mL     Comprehensive metabolic panel [[id number]]  (Abnormal) Collected:  03/23/15 1558    Specimen Information:  Blood Updated:  03/23/15 1620     Glucose 93 mg/dL      BUN 78.2 mg/dL      Creatinine 0.9 mg/dL      Sodium 956 mEq/L      Potassium 3.9 mEq/L      Chloride 110 mEq/L      CO2 21 (L) mEq/L      Calcium 9.1 mg/dL      Protein, Total 7.4 g/dL      Albumin 3.6 g/dL      AST (SGOT) 22 U/L      ALT 18 U/L      Alkaline Phosphatase 70 U/L      Bilirubin, Total 0.4 mg/dL      Globulin 3.8 (H) g/dL      Albumin/Globulin Ratio 0.9      Anion Gap 9.0     Hemolysis index [[id number]] Collected:  03/23/15 1558     Hemolysis Index 4 Updated:  03/23/15 1620    GFR [[id number]] Collected:  03/23/15 1558     EGFR >60.0 Updated:  03/23/15 1620    CBC with differential [[id number]]  (Abnormal) Collected:  03/23/15 1558    Specimen Information:  Blood from Blood Updated:  03/23/15 1608     WBC 6.43 x10 3/uL      Hgb 11.5 (L) g/dL      Hematocrit 27.2 (L) %      Platelets 345 x10 3/uL      RBC 4.44 x10 6/uL      MCV 80.2 fL      MCH 25.9 (L) pg      MCHC 32.3 g/dL      RDW 17 (H) %      MPV 9.9 fL      Neutrophils 55 %      Lymphocytes Automated 32 %      Monocytes 8 %      Eosinophils Automated 5 %      Basophils Automated 0 %      Immature Granulocyte 0 %      Nucleated RBC 0 /100 WBC      Neutrophils Absolute 3.55 x10 3/uL  Abs Lymph Automated 2.06 x10 3/uL      Abs Mono Automated 0.49 x10 3/uL      Abs Eos Automated 0.30 x10 3/uL      Absolute Baso Automated 0.03 x10 3/uL      Absolute Immature Granulocyte 0.01 x10 3/uL           Radiologic Studies -   Radiology Results (24 Hour)     Procedure Component Value Units Date/Time    XR Chest  AP Portable [[id number]] Collected:  03/23/15 1751    Order Status:  Completed Updated:  03/23/15 1756    Narrative:      HISTORY: Chest pain     COMPARISON: 07/22/2014    TECHNIQUE: AP  portable taken at 03/23/2015 5:36 PM.    FINDINGS: The lungs are clear. There are no pleural effusions. The  pulmonary vascularity is not congested. The heart is not enlarged.      Impression:       Normal portable chest.        Lynnae Prude, MD   03/23/2015 5:52 PM        .    Medical Decision Making   I am the first provider for this patient.    I reviewed the vital signs, available nursing notes, past medical history, past surgical history, family history and social history.    Vital Signs-Reviewed the patient's vital signs.     No data found.      Pulse Oximetry Analysis - Normal 96% on RA    EKG:  Interpreted by the EP.   Time Interpreted: 1534   Rate: 86   Rhythm: Normal Sinus Rhythm    Interpretation: no ST changes   Comparison: no significant change from prior 07/22/14    Old Medical Records: Old medical records.     ED Course:   7:44 PM - Observed pt who states she is feeling better, therefore, removed oxygen.     8:07 PM - Discussed results with pt and counseled on diagnosis, f/u plans, medication use, and signs and symptoms when to return to ED.  Pt is stable and ready for discharge.         Provider Notes: 3 F with h/o asthma presents with exacerbation.  After duonebs, steroids and magnesium, patient is feeling better.  Hypoxia has resolved.  Patient given prescriptions for steroids and inhalers.  Advised to return for new or worsening symptoms.      Diagnosis     Clinical Impression:   1. Moderate persistent asthma without complication        Treatment Plan:   ED Disposition     Discharge Candis Schatz discharge to home/self care.    Condition at disposition: Stable              _______________________________      Attestations: This note is prepared by Jaci Carrel, acting as scribe for Brooke Bonito, MD. The scribe's documentation has been prepared under my direction and personally reviewed by me in its entirety.  I confirm that the note above accurately reflects all work, treatment, procedures, and  medical decision making performed by me.    _______________________________          Brooke Bonito, DO  03/28/15 [phone]

## 2015-03-23 NOTE — Discharge Instructions (Signed)
Asthma    You have been seen for an asthma attack.    Asthma is also called reactive airway disease.    This disease occurs when the breathing tubes are irritated and begin to spasm. The irritation causes the tubes to swell and make extra mucus. The spasms make it difficult to breathe, especially when exhaling (breathing out). Some medications treat the spasm. An example is bronchodilators, like albuterol (Ventolin/Proventil). Other medications treat the inflammation. An example is steroids, like prednisone (Prelone/Orapred).     Symptoms may include wheezing, dry coughing, difficulty breathing, and chest pain and tightness. Coughing until you vomit may also be a sign of asthma.    Asthma attacks can be caused by medications (like aspirin), smoke, air pollution, exercise, dust mites or pet dander.    Asthma attacks are serious. They can be life-threatening!    Do not smoke. Research has proven that smoking causes heart disease, cancer, and birth defects. Avoiding smoking will help your asthma. If you smoke, ask your doctor for ideas about how to stop.   If you do not smoke, avoid others who do. Smoke will irritate your lungs and make your asthma worse.    Avoid things that irritate your lungs. These might include smoke, pollen, dust, mold, mildew, pets and perfumes. If you are allergic to pollen, then stay indoors when pollen counts are high.    Work closely with your doctor to control your asthma.    Use your albuterol (Ventolin/Proventil) inhaler every 4 hours as needed for wheezing, coughing or shortness of breath. Using this medication as directed will help symptoms. It is best to use a spacer to help the medicine reach your lungs. Your doctor can prescribe a spacer.    Discuss with your doctor how to measure your breathing with a handheld peak flow meter. This will help your doctor to adjust your treatment.    Do not wait until your inhaler is empty before getting a refill. To check how full  your inhaler is, remove the mouthpiece and place the inhaler in a bowl of water. If it floats near the surface of the water with the nipple end pointing down, it is half full. If it floats flat on the surface, it is empty. Call your doctor immediately for a refill.    IT IS VERY IMPORTANT to use the inhaler as instructed. Using it too much can cause serious problems.    YOU SHOULD SEEK MEDICAL ATTENTION IMMEDIATELY, EITHER HERE OR AT THE NEAREST EMERGENCY DEPARTMENT, IF ANY OF THE FOLLOWING OCCURS:   You do not improve in 48 to 72 hours or your symptoms get worse.   You vomit, you cannot keep medication down, or you feel dizzy, weak, or confused.   Your shortness of breath gets worse.   Your inhaler does not help your breathing.   You feel chest pain.   You cough up green or yellow material.   You have a fever (temperature higher than 100.4F / 38C).   You wheeze or have difficulty breathing.

## 2015-03-23 NOTE — ED Notes (Signed)
Cough, trouble breathing, productive, fever and chest pain with cough for 2 days.

## 2015-03-24 LAB — ECG 12-LEAD
Atrial Rate: 86 {beats}/min
P Axis: 74 degrees
P-R Interval: 144 ms
Q-T Interval: 358 ms
QRS Duration: 84 ms
QTC Calculation (Bezet): 428 ms
R Axis: 50 degrees
T Axis: 17 degrees
Ventricular Rate: 86 {beats}/min

## 2015-08-11 ENCOUNTER — Emergency Department: Payer: Self-pay

## 2015-08-11 ENCOUNTER — Emergency Department
Admission: EM | Admit: 2015-08-11 | Discharge: 2015-08-12 | Disposition: A | Discharge status: Home / Self Care | Payer: Self-pay | Attending: Internal Medicine | Admitting: Internal Medicine

## 2015-08-11 DIAGNOSIS — J45909 Unspecified asthma, uncomplicated: Secondary | ICD-10-CM | POA: Insufficient documentation

## 2015-08-11 DIAGNOSIS — N83201 Unspecified ovarian cyst, right side: Secondary | ICD-10-CM | POA: Insufficient documentation

## 2015-08-11 DIAGNOSIS — F17200 Nicotine dependence, unspecified, uncomplicated: Secondary | ICD-10-CM | POA: Insufficient documentation

## 2015-08-11 DIAGNOSIS — N939 Abnormal uterine and vaginal bleeding, unspecified: Secondary | ICD-10-CM | POA: Insufficient documentation

## 2015-08-11 LAB — CBC AND DIFFERENTIAL
Basophils Absolute Automated: 0.03 10*3/uL (ref 0.00–0.20)
Basophils Automated: 0 %
Eosinophils Absolute Automated: 0.16 10*3/uL (ref 0.00–0.70)
Eosinophils Automated: 2 %
Hematocrit: 37.1 % (ref 37.0–47.0)
Hgb: 12.2 g/dL (ref 12.0–16.0)
Immature Granulocytes Absolute: 0.01 10*3/uL
Immature Granulocytes: 0 %
Lymphocytes Absolute Automated: 3.2 10*3/uL (ref 0.50–4.40)
Lymphocytes Automated: 44 %
MCH: 26.6 pg — ABNORMAL LOW (ref 28.0–32.0)
MCHC: 32.9 g/dL (ref 32.0–36.0)
MCV: 80.8 fL (ref 80.0–100.0)
MPV: 10.1 fL (ref 9.4–12.3)
Monocytes Absolute Automated: 0.47 10*3/uL (ref 0.00–1.20)
Monocytes: 6 %
Neutrophils Absolute: 3.35 10*3/uL (ref 1.80–8.10)
Neutrophils: 46 %
Nucleated RBC: 1 /100 WBC (ref 0–1)
Platelets: 385 10*3/uL (ref 140–400)
RBC: 4.59 10*6/uL (ref 4.20–5.40)
RDW: 17 % — ABNORMAL HIGH (ref 12–15)
WBC: 7.21 10*3/uL (ref 3.50–10.80)

## 2015-08-11 LAB — URINALYSIS, REFLEX TO MICROSCOPIC EXAM IF INDICATED
Bilirubin, UA: NEGATIVE
Glucose, UA: NEGATIVE
Ketones UA: 20 — AB
Leukocyte Esterase, UA: NEGATIVE
Nitrite, UA: NEGATIVE
Protein, UR: 100 — AB
Specific Gravity UA: 1.017 (ref 1.001–1.035)
Urine pH: 6 (ref 5.0–8.0)
Urobilinogen, UA: NEGATIVE mg/dL

## 2015-08-11 LAB — TYPE AND SCREEN
AB Screen Gel: NEGATIVE
ABO Rh: O POS

## 2015-08-11 LAB — LIPASE: Lipase: 29 U/L (ref 8–78)

## 2015-08-11 LAB — HCG QUANTITATIVE: hCG, Quant.: <1.2

## 2015-08-11 MED ORDER — MORPHINE SULFATE 4 MG/ML IJ/IV SOLN (WRAP)
4.0000 mg | Freq: Once | Status: AC
Start: 2015-08-11 — End: 2015-08-11
  Administered 2015-08-11: 4 mg via INTRAVENOUS
  Filled 2015-08-11: qty 1

## 2015-08-11 MED ORDER — AMLODIPINE BESYLATE 5 MG PO TABS
10.0000 mg | ORAL_TABLET | Freq: Every day | ORAL | Status: DC
Start: 2015-08-12 — End: 2015-08-12

## 2015-08-11 MED ORDER — AMLODIPINE BESYLATE 5 MG PO TABS
ORAL_TABLET | ORAL | Status: AC
Start: 2015-08-11 — End: 2015-08-11
  Administered 2015-08-11: 10 mg via ORAL
  Filled 2015-08-11: qty 2

## 2015-08-11 MED ORDER — ONDANSETRON HCL 4 MG/2ML IJ SOLN
4.0000 mg | Freq: Once | INTRAMUSCULAR | Status: AC
Start: 2015-08-11 — End: 2015-08-11
  Administered 2015-08-11: 4 mg via INTRAVENOUS
  Filled 2015-08-11: qty 2

## 2015-08-11 NOTE — ED Provider Notes (Signed)
EMERGENCY DEPARTMENT HISTORY AND PHYSICAL EXAM     Physician/Midlevel provider first contact with patient: 08/11/15 2213         Date: 08/11/2015  Patient Name: Wendy Preston    History of Presenting Illness     Chief Complaint   Patient presents with   . Abdominal Pain   . Vaginal Bleeding       History Provided By: Patient    Chief Complaint: Vaginal bleeding  Onset: 14 days ago  Timing: Constant  Severity: Moderate  Quality: With fhx of cervical and bladder cancer   Modifying factors: Motrin  Associated Symptoms: suprapubic Abd pain, lightheadedness  Pertinent Negatives: No  fever, N/V/D constipation, and dysuria.    Additional History: Wendy Preston is a 44 y.o. female presenting to the ED with vaginal bleeding for 14 days and suprapubic abd pain. She c/o lightheadedness on standing. Pt states that this has never happened before and she usually has regular periods. Pt has taken Motrin for her pain at 6 PM. Pt does not have a current gyn. She reports a maternal fhx of cervical cancer and death by bladder cancer. Pt takes Amlodipine, 5 mg but has run out of it.     She denies a h/o fibroids, fever, N/V/D constipation, and dysuria.    PCP: Burr Medico, MD  SPECIALISTS:    Current Facility-Administered Medications   Medication Dose Route Frequency Provider Last Rate Last Dose   . amLODIPine (NORVASC) tablet 10 mg  10 mg Oral Daily Azzie Glatter, MD   10 mg at 08/11/15 2324     Current Outpatient Prescriptions   Medication Sig Dispense Refill   . Acetaminophen-Codeine (TYLENOL/CODEINE #3) 300-30 MG per tablet Take 1-2 tablets by mouth every 6 (six) hours as needed for Pain (as needed for pain or cough). 15 tablet 0   . amLODIPine (NORVASC) 10 MG tablet Take 1 tablet (10 mg total) by mouth daily. 30 tablet 0   . meclizine (ANTIVERT) 25 MG tablet Take 1 tablet (25 mg total) by mouth every 6 (six) hours as needed for Dizziness. 30 tablet 0   . medroxyPROGESTERone (PROVERA) 2.5 MG tablet Take 4 tablets (10 mg total) by  mouth daily. 10 tablet 0   . ondansetron (ZOFRAN ODT) 4 MG disintegrating tablet Take 1 tablet (4 mg total) by mouth every 6 (six) hours as needed for Nausea. 30 tablet 1   . traMADol (ULTRAM) 50 MG tablet Take 1 tablet (50 mg total) by mouth every 6 (six) hours as needed for Pain. Do not drive or operate machinery while taking this medication 30 tablet 0       Past History     Past Medical History:  Past Medical History   Diagnosis Date   . Asthma without status asthmaticus        Past Surgical History:  Past Surgical History   Procedure Laterality Date   . Cesarean section         Family History:  No family history on file.    Social History:  Social History   Substance Use Topics   . Smoking status: Current Every Day Smoker   . Smokeless tobacco: None   . Alcohol Use: Yes       Allergies:  Allergies   Allergen Reactions   . Penicillins        Review of Systems     Review of Systems   Constitutional: Negative for fever.   HENT: Negative for  nosebleeds.    Eyes: Negative for photophobia.   Respiratory: Negative for cough.    Cardiovascular: Negative for chest pain.   Gastrointestinal: Positive for abdominal pain. Negative for nausea, diarrhea and constipation.   Genitourinary: Positive for vaginal bleeding. Negative for dysuria.   Musculoskeletal: Negative for back pain.   Skin: Negative for rash.   Neurological: Positive for light-headedness.   Psychiatric/Behavioral: Negative for behavioral problems.     Physical Exam   BP 154/87 mmHg  Pulse 75  Temp(Src) 99.3 F (37.4 C) (Oral)  Resp 16  Wt 107.956 kg  SpO2 99%  LMP 08/11/2015  Physical Exam   Constitutional: She is oriented to person, place, and time. She appears well-developed and well-nourished. She appears distressed (mild).   HENT:   Head: Normocephalic and atraumatic.   Eyes: Conjunctivae are normal. Right eye exhibits no discharge. Left eye exhibits no discharge. No scleral icterus.   Neck: Normal range of motion. Neck supple.   Cardiovascular:  Normal rate, regular rhythm and normal heart sounds.    Pulmonary/Chest: Effort normal and breath sounds normal. No respiratory distress. She has no wheezes. She has no rales.   Abdominal: Soft. Bowel sounds are normal. She exhibits no distension. There is no tenderness. There is no rebound and no guarding.   Musculoskeletal: She exhibits no edema or tenderness.   Neurological: She is alert and oriented to person, place, and time.   Skin: Skin is warm and dry. She is not diaphoretic.   Psychiatric: Her speech is normal and behavior is normal. Judgment and thought content normal. Her mood appears anxious. Cognition and memory are normal.   Nursing note and vitals reviewed.        Diagnostic Study Results     Labs -     Results     Procedure Component Value Units Date/Time    Type and Screen [[id number]] Collected:  08/11/15 2205    Specimen Information:  Blood Updated:  08/11/15 2320     ABO Rh O POS      AB Screen Gel NEG     hCG, Quantitative [[id number]] Collected:  08/11/15 2205     hCG, Quant. <1.2 Updated:  08/11/15 2316    UA, Reflex to Microscopic (pts 3 + yrs) [[id number]]  (Abnormal) Collected:  08/11/15 2225    Specimen Information:  Urine Updated:  08/11/15 2256     Urine Type Clean Catch      Color, UA Yellow      Clarity, UA Hazy      Specific Gravity UA 1.017      Urine pH 6.0      Leukocyte Esterase, UA Negative      Nitrite, UA Negative      Protein, UR 100 (A)      Glucose, UA Negative      Ketones UA 20 (A)      Urobilinogen, UA Negative mg/dL      Bilirubin, UA Negative      Blood, UA Large (A)      RBC, UA TNTC (A) /hpf      WBC, UA 0 - 5 /hpf      Squamous Epithelial Cells, Urine 0 - 5 /hpf     Lipase [[id number]] Collected:  08/11/15 2205    Specimen Information:  Blood Updated:  08/11/15 2249     Lipase 29 U/L     CBC with differential [[id number]]  (Abnormal) Collected:  08/11/15 2205    Specimen Information:  Blood  from Blood Updated:  08/11/15 2236     WBC 7.21 x10 3/uL      Hgb 12.2 g/dL       Hematocrit 09.8 %      Platelets 385 x10 3/uL      RBC 4.59 x10 6/uL      MCV 80.8 fL      MCH 26.6 (L) pg      MCHC 32.9 g/dL      RDW 17 (H) %      MPV 10.1 fL      Neutrophils 46 %      Lymphocytes Automated 44 %      Monocytes 6 %      Eosinophils Automated 2 %      Basophils Automated 0 %      Immature Granulocyte 0 %      Nucleated RBC 1 /100 WBC      Neutrophils Absolute 3.35 x10 3/uL      Abs Lymph Automated 3.20 x10 3/uL      Abs Mono Automated 0.47 x10 3/uL      Abs Eos Automated 0.16 x10 3/uL      Absolute Baso Automated 0.03 x10 3/uL      Absolute Immature Granulocyte 0.01 x10 3/uL           Radiologic Studies -   Radiology Results (24 Hour)     Procedure Component Value Units Date/Time    US Pelvic with Transvaginal (no Doppler) [[id number]] Collected:  08/11/15 2258    Order Status:  Completed Updated:  08/11/15 2303    Narrative:      Indication: Abnormal vaginal bleeding and pelvic pain.    TECHNIQUE: Transvaginal imaging.    FINDINGS: The uterus measures 9.6 x 4.5 x 6.1 cm for total volume of 120  cc's. The endometrial stripe is secretory measures 10.7 mm. Nabothian  cyst in the cervix.  The right ovary measures 4.8 x 2.1 x 3 cm and contains 2 simple cyst  measuring 2.1 x 0.9 x 2.6 cm and 2.0 x 1.0 x 1.5 cm. Smaller follicles.  The left ovary measures 2.6 x 1.4 x 2.7 cm.  No free fluid.    Doppler demonstrates blood flow to both ovaries.      Impression:       Right ovarian cyst measuring 2.6 and 2.0 cm.. No free fluid.  Secretory endometrium    Kinnie Feil, MD   08/11/2015 10:59 PM        .    Medical Decision Making   I am the first provider for this patient.    I reviewed the vital signs, available nursing notes, past medical history, past surgical history, family history and social history.    Vital Signs-Reviewed the patient's vital signs.     Patient Vitals for the past 12 hrs:   BP Temp Pulse Resp   08/12/15 0102 154/87 mmHg - 75 16   08/11/15 2232 143/80 mmHg - 78 16   08/11/15 2130 (!)  180/109 mmHg 99.3 F (37.4 C) 81 18       Pulse Oximetry Analysis - Normal 98% on RA    Old Medical Records: Nursing notes.     ED Course:     10:20 PM - Discussed tx plan, including blood work and an [place]. Pt is in agreement.     12:26 AM - Discussed pt case with Dr. Carloyn Manner, Ob/Gyn, who will consult.    12:36 AM - Updated pt  on results thus far.  Will give more pain meds.     12:51 AM - Ob/Gyn states that the pt is cleared to go home with rx for Provera 10 mg daily for 10 days. Outpatient f/u with a gyn. List provided to pt.     12:59 AM - Discussed all results with pt and counseled on diagnosis, f/u plans, medication use, and signs and symptoms when to return to ED.  Pt is stable and ready for discharge.       Provider Notes:   Pt will f/u with p. rana for her bp.     Diagnosis     Clinical Impression:   1. Abnormal vaginal bleeding    2. Cyst of right ovary    3. Essential hypertension    Treatment Plan:   ED Disposition     Discharge Candis Schatz discharge to home/self care.    Condition at disposition: Stable              _______________________________      Attestations: This note is prepared by Barb Merino acting as scribe for [employer]. Benjaman Kindler, M.D. The scribe's documentation has been prepared under my direction and personally reviewed by me in its entirety.  I confirm that the note above accurately reflects all work, treatment, procedures, and medical decision making performed by me.      _______________________________    Azzie Glatter, MD  08/12/15 [phone]

## 2015-08-11 NOTE — ED Notes (Signed)
Pt comes in c/o abd pain w/ vaginal bleeding that has been going on x2 wks.  In the past 24hrs she has changed her pad 7 times.  -N/V.  Pt states that she never gets a menstrual cycle this bad.

## 2015-08-12 MED ORDER — ACETAMINOPHEN-CODEINE 300-30 MG PO TABS
1.0000 | ORAL_TABLET | Freq: Four times a day (QID) | ORAL | Status: DC | PRN
Start: 2015-08-12 — End: 2016-02-02

## 2015-08-12 MED ORDER — KETOROLAC TROMETHAMINE 30 MG/ML IJ SOLN
30.0000 mg | Freq: Once | INTRAMUSCULAR | Status: AC
Start: 2015-08-12 — End: 2015-08-12
  Administered 2015-08-12: 30 mg via INTRAVENOUS
  Filled 2015-08-12: qty 1

## 2015-08-12 MED ORDER — ONDANSETRON HCL 4 MG/2ML IJ SOLN
4.0000 mg | Freq: Once | INTRAMUSCULAR | Status: AC
Start: 2015-08-12 — End: 2015-08-12
  Administered 2015-08-12: 4 mg via INTRAVENOUS
  Filled 2015-08-12: qty 2

## 2015-08-12 MED ORDER — MEDROXYPROGESTERONE ACETATE 2.5 MG PO TABS
10.0000 mg | ORAL_TABLET | Freq: Every day | ORAL | Status: DC
Start: 2015-08-12 — End: 2024-03-26

## 2015-08-12 MED ORDER — AMLODIPINE BESYLATE 10 MG PO TABS
10.0000 mg | ORAL_TABLET | Freq: Every day | ORAL | Status: AC
Start: 2015-08-12 — End: ?

## 2015-08-12 NOTE — Discharge Instructions (Signed)
Abnormal Vaginal Bleeding     You have been diagnosed with abnormal vaginal bleeding.     Women normally bleed when they have their periods (menstruation). Any other bleeding is called abnormal. This may include bleeding that is heavier than normal or that happens at a time when you don’t normally bleed. You may have other symptoms. These could be pelvic/abdominal (belly) pain or cramps. You could also have back pain, nausea (feeling sick to your stomach) or problems urinating (peeing). You may also feel tired or have emotional disturbances.     This is a common problem. Most women have it at some time in their lives.     The cause is usually a mild change in hormone levels. The problem often goes away on its own.     Some other causes are: Changes in birth control use, too much exercise, stress, rapid weight changes, or being overweight. It is also caused by infections, thyroid problems or injury to the vagina/cervix or uterus.     The cause can also be more serious. However, this is rare. More serious causes include cancer of the cervix or uterus. Therefore, it is important to have a follow-up appointment. See your OB-GYN doctor or primary care doctor for the follow-up.     The health care professional who saw you feels it is OK for you to go home.     You may need to return here or go to the nearest Emergency Department if you develop more symptoms. An increase in symptoms might mean you are having complications from your bleeding. These may include very heavy bleeding and/or infection.     It is important to have a follow-up. See your gynecologist or family doctor in the next few days. We may contact your doctor about this abnormal vaginal bleeding evaluation. If not, be sure to tell him or her about it.  · If you don t have the right follow-up care available, tell the medical staff before you go. They can help make arrangements for you.     YOU SHOULD SEEK MEDICAL ATTENTION IMMEDIATELY, EITHER HERE OR AT THE  NEAREST EMERGENCY DEPARTMENT, IF ANY OF THE FOLLOWING OCCURS:  · You have worse pain in the abdomen (belly), pelvis or back.  · Very large amounts of vaginal bleeding, soaking of pads/tampons (more than one pad per hour), passage of large clots.  · Fever (temperature higher than 100.4ºF / 38ºC), chills, nausea, vomiting.  · You re dizzy, lightheaded, or you pass out.              Ovarian Cyst     You have been diagnosed with an ovarian cyst.     Ovarian cysts are common in women with abdominal (belly) pain, pelvic pain or cramping.     Ovarian cysts are like small bubbles filled with fluid. These cysts are also called "ovarian follicles." They form in the ovary during a normal menstrual cycle (period). During your cycle, an egg ripens inside the cyst. It is getting ready for ovulation. Sometimes a follicle gets large or many follicles form. This can cause pelvic pain or cramps.     If the cyst gets too big, it may break open. When this happens, it releases blood and fluid into the abdomen (belly). This causes sudden severe pain. The pain often gets better within a few hours with no treatment. Some people need pain medicine.     A few women have a condition called polycystic ovarian disease. In these women, several cysts form on both ovaries during the menstrual cycle. This causes irregular (  unpredictable) menstrual periods. This condition is caused by hormones.     To diagnose polycystic ovarian disease, a doctor usually examines a woman’s pelvis and asks questions about her symptoms. If the doctor cannot feel the cysts with his or her hands, an ultrasound may be needed.     Emergency ultrasound testing for cysts is rarely needed. Instead, the test is usually during regular weekday office hours in the radiology department. The medical staff will schedule the test in the next few days. You may also see your regular doctor instead to schedule the ultrasound.     The doctor has decided it is OK for you to go  home.     If symptoms signal complications like infection or bleeding, you may need to come here or to the nearest Emergency Department.     Follow up with your gynecologist or regular doctor in the next few days. Tell your doctor about this visit.  · If you do not have a doctor or clinic to follow up with, tell the medical staff before leaving today. We can help make the arrangements for you.     YOU SHOULD SEEK MEDICAL ATTENTION IMMEDIATELY, EITHER HERE OR AT THE NEAREST EMERGENCY DEPARTMENT, IF ANY OF THE FOLLOWING OCCURS:  · You have worse pain in the abdomen (belly), pelvis or back.  · More and more vaginal discharge or bleeding, or passing large blood clots.  · Fever (temperature higher than 100.4ºF / 38ºC), chills, nausea, vomiting (throwing up).  · Feeling dizzy, lightheaded or passing out.

## 2015-08-12 NOTE — H&P (Signed)
GYNECOLOGY ADMISSION HISTORY AND PHYSICAL EXAM  [facility]  Antonieta Pert and Vishwakarma  08/12/2015 12:36 AM    Chief Complaint:   Abdominal Pain and Vaginal Bleeding  C/O vaginal bleeding since last 10 days, with cramping  Bleeding like a period, not too heavy  First such episode  Usual cycles: 3/30  LMP: nov 28 and before that nov 2  Not on any contraceptive method    No gyn doctor:recently moved from [place]    History of Presenting Illness:   Wendy Preston is a 44 y.o. G4P4  with Patient's last menstrual period was 08/11/2015. who presents to the hospital with menorrhagia.  No chest pain/palpitations/ light headedness  No hot flashes    Past Medical History:     Past Medical History   Diagnosis Date   . Asthma without status asthmaticus        Past Surgical History:     Past Surgical History   Procedure Laterality Date   . Cesarean section       Past Gynecologic History:   Patient's last menstrual period was 08/11/2015.  None. No STDs, no PID, has hx of abnormal pap smears in the past on and off, never had treatment.  Last pap last year and normal  Past Obstetrical History:     OB History   No data available   4 kids, all cesarean  Not sexually active now    Medications:      (Not in a hospital admission)  Allergies:     Allergies   Allergen Reactions   . Penicillins        Social History:   Smokes 4 cig/day  Social History     Social History   . Marital Status: Single     Spouse Name: N/A   . Number of Children: N/A   . Years of Education: N/A     Social History Main Topics   . Smoking status: Current Every Day Smoker   . Smokeless tobacco: Not on file   . Alcohol Use: Yes   . Drug Use: No   . Sexual Activity: Not on file     Other Topics Concern   . Not on file     Social History Narrative     Family History:   No family history on file.  Mother had cervical cancer and had hysterectomy  Maternal aunt with breast cancer at age 48    Review of Systems: An extended review of systems was  obtained from the patient. For the following systems the patient denied any symptomatology: reproductive, constitutional symptoms such as fever or weight loss, ears, nose, mouth, throat, cardiovascular, respiratory, gastrointestinal, genitourinary, musculoskeletal, integumentary, neurological, psychiatric and endocrine. For the following systems, the patient reported symptoms: none.    Physical Exam:     Filed Vitals:    08/11/15 2232   BP: 143/80   Pulse: 78   Temp:    Resp: 16   SpO2: 100%     Gen: NAD, appears comfortable, supine in bed, AAOx3  Neurological: oriented to time, place and location  Lungs: CTAB w/o W/R/R  Cardiac: RRR w/o M/G/R  Abdomen: soft, mildly obese, no G/R/M, NABS, midline puckered scar from previous cesarean  Ext: calves symmetric and nontender w/o c/e/e   Pelvic: deferred     Recent Labs:     Recent Labs  Lab 08/11/15  2205   WBC 7.21   HGB 12.2   PLATELETS 385  ABO RH O POS     Results     Procedure Component Value Units Date/Time    Type and Screen [[id number]] Collected:  08/11/15 2205    Specimen Information:  Blood Updated:  08/11/15 2320     ABO Rh O POS      AB Screen Gel NEG     hCG, Quantitative [[id number]] Collected:  08/11/15 2205     hCG, Quant. <1.2 Updated:  08/11/15 2316    UA, Reflex to Microscopic (pts 3 + yrs) [[id number]]  (Abnormal) Collected:  08/11/15 2225    Specimen Information:  Urine Updated:  08/11/15 2256     Urine Type Clean Catch      Color, UA Yellow      Clarity, UA Hazy      Specific Gravity UA 1.017      Urine pH 6.0      Leukocyte Esterase, UA Negative      Nitrite, UA Negative      Protein, UR 100 (A)      Glucose, UA Negative      Ketones UA 20 (A)      Urobilinogen, UA Negative mg/dL      Bilirubin, UA Negative      Blood, UA Large (A)      RBC, UA TNTC (A) /hpf      WBC, UA 0 - 5 /hpf      Squamous Epithelial Cells, Urine 0 - 5 /hpf     Lipase [[id number]] Collected:  08/11/15 2205    Specimen Information:  Blood Updated:  08/11/15 2249     Lipase 29 U/L      CBC with differential [[id number]]  (Abnormal) Collected:  08/11/15 2205    Specimen Information:  Blood from Blood Updated:  08/11/15 2236     WBC 7.21 x10 3/uL      Hgb 12.2 g/dL      Hematocrit 03.4 %      Platelets 385 x10 3/uL      RBC 4.59 x10 6/uL      MCV 80.8 fL      MCH 26.6 (L) pg      MCHC 32.9 g/dL      RDW 17 (H) %      MPV 10.1 fL      Neutrophils 46 %      Lymphocytes Automated 44 %      Monocytes 6 %      Eosinophils Automated 2 %      Basophils Automated 0 %      Immature Granulocyte 0 %      Nucleated RBC 1 /100 WBC      Neutrophils Absolute 3.35 x10 3/uL      Abs Lymph Automated 3.20 x10 3/uL      Abs Mono Automated 0.47 x10 3/uL      Abs Eos Automated 0.16 x10 3/uL      Absolute Baso Automated 0.03 x10 3/uL      Absolute Immature Granulocyte 0.01 x10 3/uL           Radiology:     Radiology Results (24 Hour)     Procedure Component Value Units Date/Time    US Pelvic with Transvaginal (no Doppler) [[id number]] Collected:  08/11/15 2258    Order Status:  Completed Updated:  08/11/15 2303    Narrative:      Indication: Abnormal vaginal bleeding and pelvic pain.    TECHNIQUE: Transvaginal imaging.    FINDINGS: The uterus  measures 9.6 x 4.5 x 6.1 cm for total volume of 120  cc's. The endometrial stripe is secretory measures 10.7 mm. Nabothian  cyst in the cervix.  The right ovary measures 4.8 x 2.1 x 3 cm and contains 2 simple cyst  measuring 2.1 x 0.9 x 2.6 cm and 2.0 x 1.0 x 1.5 cm. Smaller follicles.  The left ovary measures 2.6 x 1.4 x 2.7 cm.  No free fluid.    Doppler demonstrates blood flow to both ovaries.      Impression:       Right ovarian cyst measuring 2.6 and 2.0 cm.. No free fluid.  Secretory endometrium    Kinnie Feil, MD   08/11/2015 10:59 PM             Assessment and Plan:   1. 44 y.o. G4P4 admitted for DUB  2. Provera 10 mg po qd for 10 days  3. Options discussed BCP vs mirena: needs to follow up with Gyn  4. Discharge home now  5. Communicates understanding of the plan  6.  Explained that mother had cervical cancer, it usually is related to HPV infection and not like a genetic syndrome    Joycelyn Rua, MD

## 2015-08-20 MED ORDER — MEDROXYPROGESTERONE ACETATE 2.5 MG PO TABS
10.0000 mg | ORAL_TABLET | Freq: Every day | ORAL | Status: DC
Start: 2015-08-20 — End: 2024-03-26

## 2015-08-20 NOTE — ED Provider Notes (Signed)
9:03 PM -   I was asked by the charge nurse to refill a prescription for Provera for the patient  I have reviewed the note and ALLERGIES for the patient.  The consult note under the ED progress does state that the patient should be on 10 mg by mouth Provera daily  To complete her course she needs another 30 tablets.  I have called the prescription in to the [facility] on [address] [facility] in [place], [place] the phone number was [phone]  Judi Cong, MD, Keenan Bachelor, MD  08/20/15 2106

## 2016-02-02 ENCOUNTER — Emergency Department
Admission: EM | Admit: 2016-02-02 | Discharge: 2016-02-02 | Disposition: A | Discharge status: Home / Self Care | Payer: Self-pay | Attending: Internal Medicine | Admitting: Internal Medicine

## 2016-02-02 ENCOUNTER — Emergency Department: Payer: Self-pay

## 2016-02-02 DIAGNOSIS — Z79899 Other long term (current) drug therapy: Secondary | ICD-10-CM | POA: Insufficient documentation

## 2016-02-02 DIAGNOSIS — I10 Essential (primary) hypertension: Secondary | ICD-10-CM | POA: Insufficient documentation

## 2016-02-02 DIAGNOSIS — J45901 Unspecified asthma with (acute) exacerbation: Secondary | ICD-10-CM | POA: Insufficient documentation

## 2016-02-02 HISTORY — DX: Essential (primary) hypertension: I10

## 2016-02-02 LAB — CBC AND DIFFERENTIAL
Basophils Absolute Automated: 0.03 10*3/uL (ref 0.00–0.20)
Basophils Automated: 0 %
Eosinophils Absolute Automated: 0.18 10*3/uL (ref 0.00–0.70)
Eosinophils Automated: 3 %
Hematocrit: 36.1 % — ABNORMAL LOW (ref 37.0–47.0)
Hgb: 11.6 g/dL — ABNORMAL LOW (ref 12.0–16.0)
Immature Granulocytes Absolute: 0 10*3/uL
Immature Granulocytes: 0 %
Lymphocytes Absolute Automated: 2.5 10*3/uL (ref 0.50–4.40)
Lymphocytes Automated: 40 %
MCH: 24 pg — ABNORMAL LOW (ref 28.0–32.0)
MCHC: 32.1 g/dL (ref 32.0–36.0)
MCV: 74.6 fL — ABNORMAL LOW (ref 80.0–100.0)
MPV: 9.7 fL (ref 9.4–12.3)
Monocytes Absolute Automated: 0.45 10*3/uL (ref 0.00–1.20)
Monocytes: 7 %
Neutrophils Absolute: 3.02 10*3/uL (ref 1.80–8.10)
Neutrophils: 49 %
Nucleated RBC: 0 /100 WBC (ref 0–1)
Platelets: 362 10*3/uL (ref 140–400)
RBC: 4.84 10*6/uL (ref 4.20–5.40)
RDW: 17 % — ABNORMAL HIGH (ref 12–15)
WBC: 6.18 10*3/uL (ref 3.50–10.80)

## 2016-02-02 LAB — COMPREHENSIVE METABOLIC PANEL
ALT: 17 U/L (ref 0–55)
AST (SGOT): 19 U/L (ref 5–34)
Albumin/Globulin Ratio: 0.9 (ref 0.9–2.2)
Albumin: 3.9 g/dL (ref 3.5–5.0)
Alkaline Phosphatase: 73 U/L (ref 37–106)
Anion Gap: 7 (ref 5.0–15.0)
BUN: 6 mg/dL — ABNORMAL LOW (ref 7.0–19.0)
Bilirubin, Total: 0.4 mg/dL (ref 0.2–1.2)
CO2: 22 mEq/L (ref 22–29)
Calcium: 9.9 mg/dL (ref 8.5–10.5)
Chloride: 106 mEq/L (ref 100–111)
Creatinine: 0.9 mg/dL (ref 0.6–1.0)
Globulin: 4.3 g/dL — ABNORMAL HIGH (ref 2.0–3.6)
Glucose: 94 mg/dL (ref 70–100)
Potassium: 3.9 mEq/L (ref 3.5–5.1)
Protein, Total: 8.2 g/dL (ref 6.0–8.3)
Sodium: 135 mEq/L — ABNORMAL LOW (ref 136–145)

## 2016-02-02 LAB — URINE BHCG POC: Urine bHCG POC: NEGATIVE

## 2016-02-02 LAB — GFR: EGFR: >60

## 2016-02-02 LAB — TROPONIN I: Troponin I: <0.01 ng/mL (ref 0.00–0.09)

## 2016-02-02 LAB — HEMOLYSIS INDEX: Hemolysis Index: 4 (ref 0–18)

## 2016-02-02 MED ORDER — ALBUTEROL SULFATE (2.5 MG/3ML) 0.083% IN NEBU
5.0000 mg | INHALATION_SOLUTION | Freq: Once | RESPIRATORY_TRACT | Status: AC
Start: 2016-02-02 — End: 2016-02-02
  Administered 2016-02-02: 5 mg via RESPIRATORY_TRACT
  Filled 2016-02-02: qty 6

## 2016-02-02 MED ORDER — ALBUTEROL SULFATE HFA 108 (90 BASE) MCG/ACT IN AERS
2.0000 | INHALATION_SPRAY | Freq: Four times a day (QID) | RESPIRATORY_TRACT | Status: DC | PRN
Start: 2016-02-02 — End: 2016-02-02
  Administered 2016-02-02: 2 via RESPIRATORY_TRACT
  Filled 2016-02-02: qty 2.25

## 2016-02-02 MED ORDER — IPRATROPIUM BROMIDE 0.02 % IN SOLN
0.5000 mg | Freq: Once | RESPIRATORY_TRACT | Status: AC
Start: 2016-02-02 — End: 2016-02-02
  Administered 2016-02-02: 0.5 mg via RESPIRATORY_TRACT
  Filled 2016-02-02: qty 2.5

## 2016-02-02 MED ORDER — METHYLPREDNISOLONE SODIUM SUCC 125 MG IJ SOLR
125.0000 mg | Freq: Once | INTRAMUSCULAR | Status: AC
Start: 2016-02-02 — End: 2016-02-02
  Administered 2016-02-02: 125 mg via INTRAVENOUS
  Filled 2016-02-02: qty 2

## 2016-02-02 MED ORDER — PREDNISONE 20 MG PO TABS
40.0000 mg | ORAL_TABLET | Freq: Every day | ORAL | Status: AC
Start: 2016-02-02 — End: 2016-02-07

## 2016-02-02 NOTE — Discharge Instructions (Signed)
Asthma    You have been seen for an asthma attack.    Asthma is also called reactive airway disease.    This disease occurs when the breathing tubes are irritated and begin to spasm. The irritation causes the tubes to swell and make extra mucus. The spasms make it difficult to breathe, especially when exhaling (breathing out). Some medications treat the spasm. An example is bronchodilators, like albuterol (Ventolin/Proventil). Other medications treat the inflammation. An example is steroids, like prednisone (Prelone/Orapred).     Symptoms may include wheezing, dry coughing, difficulty breathing, and chest pain and tightness. Coughing until you vomit may also be a sign of asthma.    Asthma attacks can be caused by medications (like aspirin), smoke, air pollution, exercise, dust mites or pet dander.    Asthma attacks are serious. They can be life-threatening!    Do not smoke. Research has proven that smoking causes heart disease, cancer, and birth defects. Avoiding smoking will help your asthma. If you smoke, ask your doctor for ideas about how to stop.   If you do not smoke, avoid others who do. Smoke will irritate your lungs and make your asthma worse.    Avoid things that irritate your lungs. These might include smoke, pollen, dust, mold, mildew, pets and perfumes. If you are allergic to pollen, then stay indoors when pollen counts are high.    Work closely with your doctor to control your asthma.    Use your albuterol (Ventolin/Proventil) inhaler every 4 hours as needed for wheezing, coughing or shortness of breath. Using this medication as directed will help symptoms. It is best to use a spacer to help the medicine reach your lungs. Your doctor can prescribe a spacer.    Discuss with your doctor how to measure your breathing with a handheld peak flow meter. This will help your doctor to adjust your treatment.    Do not wait until your inhaler is empty before getting a refill. To check how full  your inhaler is, remove the mouthpiece and place the inhaler in a bowl of water. If it floats near the surface of the water with the nipple end pointing down, it is half full. If it floats flat on the surface, it is empty. Call your doctor immediately for a refill.    IT IS VERY IMPORTANT to use the inhaler as instructed. Using it too much can cause serious problems.    YOU SHOULD SEEK MEDICAL ATTENTION IMMEDIATELY, EITHER HERE OR AT THE NEAREST EMERGENCY DEPARTMENT, IF ANY OF THE FOLLOWING OCCURS:   You do not improve in 48 to 72 hours or your symptoms get worse.   You vomit, you cannot keep medication down, or you feel dizzy, weak, or confused.   Your shortness of breath gets worse.   Your inhaler does not help your breathing.   You feel chest pain.   You cough up green or yellow material.   You have a fever (temperature higher than 100.4F / 38C).   You wheeze or have difficulty breathing.

## 2016-02-02 NOTE — ED Notes (Signed)
Wendy Preston 45 yr old female complaining of chest discomfort and sob for the past 3 weeks. Pt used albuterol this morning. BP 173/113 mmHg  Pulse 112  Temp(Src) 98.8 F (37.1 C) (Oral)  Resp 20  Ht 5\' 9"  (1.753 m)  Wt 95.255 kg  BMI 31.00 kg/m2  SpO2 96%  LMP 01/26/2016

## 2016-02-02 NOTE — ED Provider Notes (Signed)
EMERGENCY DEPARTMENT HISTORY AND PHYSICAL EXAM     Physician/Midlevel provider first contact with patient: 02/02/16 1637         Date: 02/02/2016  Patient Name: Wendy Preston    History of Presenting Illness     Chief Complaint   Patient presents with   . Shortness of Breath   . Chest Pain       History Provided By: Patient    Chief Complaint: SOB  Onset: Today  Timing: Constant  Location: Respiratory   Quality: "hard to breathe"  Severity: Moderate  Exacerbating factors: seasonal allergies and cold weather  Alleviating factors: Uses inhaler as needed  Associated Symptoms: Chest pressure due to breathing difficulty  Pertinent Negatives: fever, N/V/D, abd pain, or any other sxs.    Additional History: SHYLYN Preston is a 45 y.o. female presenting to the ED with c/o SOB that started today. Pt states that she has a hx of Asthma which usually is exacerbated during allergy season and cold weather. She uses an inhaler as needed but does not have a nebulizer at home or use steroids. She also has chest pressure that she thinks is from her tight breathing due to an asthma exacerbation but denies fever, N/V/D, abd pain or any other sxs.     PCP: Burr Medico, MD  SPECIALISTS:    No current facility-administered medications for this encounter.     Current Outpatient Prescriptions   Medication Sig Dispense Refill   . amLODIPine (NORVASC) 10 MG tablet Take 1 tablet (10 mg total) by mouth daily. 30 tablet 0   . medroxyPROGESTERone (PROVERA) 2.5 MG tablet Take 4 tablets (10 mg total) by mouth daily. 10 tablet 0   . medroxyPROGESTERone (PROVERA) 2.5 MG tablet Take 4 tablets (10 mg total) by mouth daily. 30 tablet 0   . predniSONE (DELTASONE) 20 MG tablet Take 2 tablets (40 mg total) by mouth daily. 10 tablet 0       Past History     Past Medical History:  Past Medical History   Diagnosis Date   . Asthma without status asthmaticus    . Hypertension        Past Surgical History:  Past Surgical History   Procedure Laterality Date   .  Cesarean section         Family History:  No family history on file.    Social History:  Social History   Substance Use Topics   . Smoking status: Current Every Day Smoker -- 2.00 packs/day   . Smokeless tobacco: None   . Alcohol Use: Yes       Allergies:  Allergies   Allergen Reactions   . Penicillins        Review of Systems     Review of Systems   Constitutional: Negative for fever.   HENT: Negative for nosebleeds.    Eyes: Negative for redness.   Respiratory: Positive for chest tightness and shortness of breath.    Cardiovascular: Negative for chest pain.   Gastrointestinal: Negative for nausea, vomiting, abdominal pain and diarrhea.   Genitourinary: Negative for dysuria.   Musculoskeletal: Negative for back pain.   Skin: Negative for rash.   Allergic/Immunologic:        Allergic to Penicillins   Neurological: Negative for dizziness.   Psychiatric/Behavioral: Negative for suicidal ideas.     Physical Exam   BP 140/93 mmHg  Pulse 96  Temp(Src) 99 F (37.2 C) (Oral)  Resp 18  Ht 5'  9" (1.753 m)  Wt 95.255 kg  BMI 31.00 kg/m2  SpO2 99%  LMP 01/26/2016  Physical Exam   Constitutional: She is oriented to person, place, and time. She appears well-developed and well-nourished. She appears distressed.   HENT:   Head: Normocephalic and atraumatic.   Mouth/Throat: No oropharyngeal exudate.   Eyes: Conjunctivae are normal. Right eye exhibits no discharge. Left eye exhibits no discharge. No scleral icterus.   Neck: Normal range of motion. Neck supple.   Cardiovascular: Normal rate, regular rhythm and normal heart sounds.    Pulmonary/Chest: Effort normal. No respiratory distress. She has wheezes. She has no rales.   Abdominal: Soft. Bowel sounds are normal. She exhibits no distension. There is no tenderness. There is no rebound and no guarding.   Musculoskeletal: She exhibits no edema or tenderness.   Neurological: She is alert and oriented to person, place, and time.   Skin: Skin is warm and dry. She is not  diaphoretic.   Psychiatric: Judgment normal. Her mood appears anxious.   Nursing note and vitals reviewed.        Diagnostic Study Results     Labs -     Results     Procedure Component Value Units Date/Time    Urine BHCG POC [[id number]] Collected:  02/02/16 1735     Urine bHCG POC Negative Updated:  02/02/16 1741    Troponin I [[id number]] Collected:  02/02/16 1625    Specimen Information:  Blood Updated:  02/02/16 1705     Troponin I <0.01 ng/mL     Hemolysis index [[id number]] Collected:  02/02/16 1625     Hemolysis Index 4 Updated:  02/02/16 1656    GFR [[id number]] Collected:  02/02/16 1625     EGFR >60.0 Updated:  02/02/16 1656    Comprehensive metabolic panel [[id number]]  (Abnormal) Collected:  02/02/16 1625    Specimen Information:  Blood Updated:  02/02/16 1656     Glucose 94 mg/dL      BUN 6.0 (L) mg/dL      Creatinine 0.9 mg/dL      Sodium 244 (L) mEq/L      Potassium 3.9 mEq/L      Chloride 106 mEq/L      CO2 22 mEq/L      Calcium 9.9 mg/dL      Protein, Total 8.2 g/dL      Albumin 3.9 g/dL      AST (SGOT) 19 U/L      ALT 17 U/L      Alkaline Phosphatase 73 U/L      Bilirubin, Total 0.4 mg/dL      Globulin 4.3 (H) g/dL      Albumin/Globulin Ratio 0.9      Anion Gap 7.0     CBC with differential [[id number]]  (Abnormal) Collected:  02/02/16 1625    Specimen Information:  Blood from Blood Updated:  02/02/16 1644     WBC 6.18 x10 3/uL      Hgb 11.6 (L) g/dL      Hematocrit 64.4 (L) %      Platelets 362 x10 3/uL      RBC 4.84 x10 6/uL      MCV 74.6 (L) fL      MCH 24.0 (L) pg      MCHC 32.1 g/dL      RDW 17 (H) %      MPV 9.7 fL      Neutrophils 49 %      Lymphocytes  Automated 40 %      Monocytes 7 %      Eosinophils Automated 3 %      Basophils Automated 0 %      Immature Granulocyte 0 %      Nucleated RBC 0 /100 WBC      Neutrophils Absolute 3.02 x10 3/uL      Abs Lymph Automated 2.50 x10 3/uL      Abs Mono Automated 0.45 x10 3/uL      Abs Eos Automated 0.18 x10 3/uL      Absolute Baso Automated 0.03 x10 3/uL       Absolute Immature Granulocyte 0.00 x10 3/uL           Radiologic Studies -   Radiology Results (24 Hour)     Procedure Component Value Units Date/Time    Chest AP Portable [[id number]] Collected:  02/02/16 1633    Order Status:  Completed Updated:  02/02/16 1639    Narrative:      HISTORY: Shortness of breath     COMPARISON: 03/23/2015    TECHNIQUE: AP portable taken at 02/02/2016 4:33 PM.    FINDINGS: The lungs are clear. There are no pleural effusions. The  pulmonary vascularity is not congested. The heart is not enlarged.      Impression:       Normal portable chest.     Lynnae Prude, MD   02/02/2016 4:35 PM        .    Medical Decision Making   I am the first provider for this patient.    I reviewed the vital signs, available nursing notes, past medical history, past surgical history, family history and social history.    Vital Signs-Reviewed the patient's vital signs.     No data found.      Pulse Oximetry Analysis - Normal 96% on RA    Cardiac Monitor:  Rate: 112  Rhythm:  Sinus Tachycardia     EKG:  Interpreted by the EP.   Time Interpreted: 1607   Rate: 109   Rhythm: Sinus Tachycardia    Interpretation:No acute changes      Old Medical Records: Old medical records.Nursing notes.     ED Course:   5:47 PM - Pt is feeling better. Updated pt with results. Lungs are clear on reexam .Pt feels a bit anxious and would like to stay in the ed a bit longer to relax.    6:22 PM - Pt is feeling better and would like to go home with an inhaler. She declines a prescription for a nebulizer.     6:34 PM - All results were discussed with the patient, all questions were answered, and the patient is comfortable with the disposition.    Provider Notes: lungs were clear on discharge and pt had no chest pain or shortness of breath. She will f/u with her pcp.    Diagnosis     Clinical Impression:   1. Asthma with acute exacerbation, unspecified asthma severity        Treatment Plan:   ED Disposition     Discharge Candis Schatz  discharge to home/self care.    Condition at disposition: Stable              _______________________________      Attestations: This note is prepared by Gaylene Brooks, acting as scribe for Avanell Shackleton, MD.    Avanell Shackleton, MD - The scribe's documentation has been prepared under my direction  and personally reviewed by me in its entirety.  I confirm that the note above accurately reflects all work, treatment, procedures, and medical decision making performed by me.    _______________________________    Azzie Glatter, MD  02/03/16 [phone]

## 2016-02-03 LAB — ECG 12-LEAD
Atrial Rate: 109 {beats}/min
P Axis: 71 degrees
P-R Interval: 136 ms
Q-T Interval: 340 ms
QRS Duration: 80 ms
QTC Calculation (Bezet): 457 ms
R Axis: 40 degrees
T Axis: 34 degrees
Ventricular Rate: 109 {beats}/min

## 2016-12-10 ENCOUNTER — Emergency Department
Admission: EM | Admit: 2016-12-10 | Discharge: 2016-12-10 | Disposition: A | Discharge status: Home / Self Care | Payer: Self-pay | Attending: Emergency Medicine | Admitting: Emergency Medicine

## 2016-12-10 ENCOUNTER — Emergency Department: Payer: Self-pay

## 2016-12-10 DIAGNOSIS — I10 Essential (primary) hypertension: Secondary | ICD-10-CM | POA: Insufficient documentation

## 2016-12-10 DIAGNOSIS — F17210 Nicotine dependence, cigarettes, uncomplicated: Secondary | ICD-10-CM | POA: Insufficient documentation

## 2016-12-10 DIAGNOSIS — J45909 Unspecified asthma, uncomplicated: Secondary | ICD-10-CM | POA: Insufficient documentation

## 2016-12-10 DIAGNOSIS — J069 Acute upper respiratory infection, unspecified: Secondary | ICD-10-CM | POA: Insufficient documentation

## 2016-12-10 DIAGNOSIS — Z79899 Other long term (current) drug therapy: Secondary | ICD-10-CM | POA: Insufficient documentation

## 2016-12-10 DIAGNOSIS — R091 Pleurisy: Secondary | ICD-10-CM | POA: Insufficient documentation

## 2016-12-10 LAB — ECG 12-LEAD
Atrial Rate: 93 {beats}/min
P Axis: 76 degrees
P-R Interval: 136 ms
Q-T Interval: 362 ms
QRS Duration: 86 ms
QTC Calculation (Bezet): 450 ms
R Axis: 59 degrees
T Axis: 48 degrees
Ventricular Rate: 93 {beats}/min

## 2016-12-10 LAB — URINALYSIS REFLEX TO MICROSCOPIC EXAM - REFLEX TO CULTURE
Bilirubin, UA: NEGATIVE
Glucose, UA: NEGATIVE
Ketones UA: 80
Leukocyte Esterase, UA: NEGATIVE
Nitrite, UA: NEGATIVE
Protein, UR: 30 — AB
Specific Gravity UA: 1.023 (ref 1.001–1.035)
Urine pH: 6 (ref 5.0–8.0)
Urobilinogen, UA: 4 mg/dL — AB

## 2016-12-10 LAB — URINE BHCG POC: Urine bHCG POC: NEGATIVE

## 2016-12-10 MED ORDER — ALBUTEROL SULFATE HFA 108 (90 BASE) MCG/ACT IN AERS
2.0000 | INHALATION_SPRAY | RESPIRATORY_TRACT | 0 refills | Status: AC | PRN
Start: 2016-12-10 — End: 2017-01-09

## 2016-12-10 MED ORDER — ALBUTEROL-IPRATROPIUM 2.5-0.5 (3) MG/3ML IN SOLN
6.0000 mL | Freq: Once | RESPIRATORY_TRACT | Status: AC
Start: 2016-12-10 — End: 2016-12-10
  Administered 2016-12-10: 6 mL via RESPIRATORY_TRACT
  Filled 2016-12-10: qty 6

## 2016-12-10 MED ORDER — ACETAMINOPHEN 325 MG PO TABS
650.0000 mg | ORAL_TABLET | Freq: Once | ORAL | Status: AC
Start: 2016-12-10 — End: 2016-12-10
  Administered 2016-12-10: 650 mg via ORAL
  Filled 2016-12-10: qty 2

## 2016-12-10 MED ORDER — OXYMETAZOLINE HCL 0.05 % NA SOLN
2.0000 | Freq: Two times a day (BID) | NASAL | 0 refills | Status: DC
Start: 2016-12-10 — End: 2024-03-26

## 2016-12-10 MED ORDER — BENZONATATE 100 MG PO CAPS
100.0000 mg | ORAL_CAPSULE | Freq: Once | ORAL | Status: AC
Start: 2016-12-10 — End: 2016-12-10
  Administered 2016-12-10: 100 mg via ORAL
  Filled 2016-12-10: qty 1

## 2016-12-10 MED ORDER — BENZONATATE 100 MG PO CAPS
100.0000 mg | ORAL_CAPSULE | Freq: Three times a day (TID) | ORAL | 0 refills | Status: DC | PRN
Start: 2016-12-10 — End: 2024-03-26

## 2016-12-10 MED ORDER — OXYCODONE-ACETAMINOPHEN 5-325 MG PO TABS
1.0000 | ORAL_TABLET | Freq: Once | ORAL | Status: AC
Start: 2016-12-10 — End: 2016-12-10
  Administered 2016-12-10: 12:00:00 1 via ORAL
  Filled 2016-12-10: qty 1

## 2016-12-10 MED ORDER — IBUPROFEN 400 MG PO TABS
400.0000 mg | ORAL_TABLET | Freq: Four times a day (QID) | ORAL | 0 refills | Status: DC | PRN
Start: 2016-12-10 — End: 2024-03-26

## 2016-12-10 MED ORDER — DEXAMETHASONE SOD PHOSPHATE PF 10 MG/ML IJ SOLN
10.0000 mg | Freq: Once | INTRAMUSCULAR | Status: AC
Start: 2016-12-10 — End: 2016-12-10
  Administered 2016-12-10: 10 mg via ORAL
  Filled 2016-12-10: qty 1

## 2016-12-10 MED ORDER — HYDROCODONE-ACETAMINOPHEN 5-325 MG PO TABS
1.0000 | ORAL_TABLET | Freq: Four times a day (QID) | ORAL | 0 refills | Status: DC | PRN
Start: 2016-12-10 — End: 2019-07-11

## 2016-12-10 NOTE — ED Provider Notes (Signed)
EMERGENCY DEPARTMENT HISTORY AND PHYSICAL EXAM     Physician/Midlevel provider first contact with patient: 12/10/16 1016         Date: 12/10/2016  Patient Name: Wendy Preston    History of Presenting Illness     Chief Complaint   Patient presents with   . Cough       History Provided By: Patient    Chief Complaint: cough  Duration: 4 days  Timing:  Constant  Quality: uncomfortable  Severity: Moderate  Exacerbating factors: none  Alleviating factors: none  Associated Symptoms: chest pain, back pain, loss of urinary continence  Pertinent Negatives: fever    Additional History: Wendy Preston is a 46 y.o. female with a Hx of asthma presenting to the ED with cough; onset 4 days ago.  Pt reports chest pain, back pain, and loss of urinary continence with her coughing.  She used her inhaler today but took no other medications.  The pt tk the flu vaccine this year.  The pt denies any fevers, sick contact, FHx of of early MI, or any other complaints at this time.        PCP: Burr Medico, MD  SPECIALISTS:    No current facility-administered medications for this encounter.      Current Outpatient Prescriptions   Medication Sig Dispense Refill   . albuterol (PROVENTIL HFA;VENTOLIN HFA) 108 (90 Base) MCG/ACT inhaler Inhale 2 puffs into the lungs every 4 (four) hours as needed for Wheezing or Shortness of Breath (coughing).Dispense with spacer 1 Inhaler 0   . amLODIPine (NORVASC) 10 MG tablet Take 1 tablet (10 mg total) by mouth daily. 30 tablet 0   . benzonatate (TESSALON) 100 MG capsule Take 1 capsule (100 mg total) by mouth 3 (three) times daily as needed for Cough.for up to 15 doses 15 capsule 0   . HYDROcodone-acetaminophen (NORCO) 5-325 MG per tablet Take 1 tablet by mouth every 6 (six) hours as needed (severe pain).for up to 10 doses 10 tablet 0   . ibuprofen (ADVIL,MOTRIN) 400 MG tablet Take 1 tablet (400 mg total) by mouth every 6 (six) hours as needed for Pain or Fever.for up to 30 doses 30 tablet 0   .  medroxyPROGESTERone (PROVERA) 2.5 MG tablet Take 4 tablets (10 mg total) by mouth daily. 10 tablet 0   . medroxyPROGESTERone (PROVERA) 2.5 MG tablet Take 4 tablets (10 mg total) by mouth daily. 30 tablet 0   . oxymetazoline (AFRIN) 0.05 % nasal spray 2 sprays by Nasal route 2 (two) times daily. 15 mL 0       Past History     Past Medical History:  Past Medical History:   Diagnosis Date   . Asthma without status asthmaticus    . Hypertension        Past Surgical History:  Past Surgical History:   Procedure Laterality Date   . CESAREAN SECTION         Family History:  History reviewed. No pertinent family history.    Social History:  Social History   Substance Use Topics   . Smoking status: Current Every Day Smoker     Packs/day: 2.00   . Smokeless tobacco: Current User   . Alcohol use Yes       Allergies:  Allergies   Allergen Reactions   . Penicillins        Review of Systems     Review of Systems   Constitutional: Negative for fever.   Respiratory:  Positive for cough.    Cardiovascular: Positive for chest pain.   Genitourinary:        Positive for lsos of urinary continence with coughing   Musculoskeletal: Positive for back pain.   All other systems reviewed and are negative.        Physical Exam   BP 138/83   Pulse 98   Temp 98.9 F (37.2 C) (Oral)   Resp 18   Ht 5\' 8"  (1.727 m)   Wt 99.8 kg   SpO2 96%   BMI 33.45 kg/m     Physical Exam   Constitutional: Patient is oriented to person, place, and time and well-developed, well-nourished, and in no distress.   Head: Normocephalic and atraumatic.   ENT/Eyes: EOM are normal. Pupils are equal, round, and reactive to light. MMM.  Oropharyngeal erythema.   Neck: Normal range of motion. Neck supple.   Cardiovascular: Normal rate and regular rhythm.   Pulmonary/Chest: Effort normal. No respiratory distress. End Expiratory wheezes bilaterally.  Abdominal: Soft. There is no tenderness. No rebound or guarding.  Musculoskeletal: Normal range of motion. No lower  extremity edema.  CVA TTP  Neurological: Patient is alert and oriented to person, place, and time. No gross weakness.   Skin: Skin is warm and dry.         Diagnostic Study Results     Labs -     Results     Procedure Component Value Units Date/Time    Rapid Influenza A/B Antigens [[id number]] Collected:  12/10/16 1050    Specimen:  Nasopharyngeal from Nasal Aspirate Updated:  12/10/16 1129    Narrative:       ORDER#: [id number]                                    ORDERED BY: Meryl Crutch, [place]  SOURCE: Nasal Aspirate                               COLLECTED:  12/10/16 10:50  ANTIBIOTICS AT COLL.:                                RECEIVED :  12/10/16 11:13  Influenza Rapid Antigen A&B                FINAL       12/10/16 11:28  12/10/16   Negative for Influenza A and B             Reference Range: Negative            Radiologic Studies -   Radiology Results (24 Hour)     Procedure Component Value Units Date/Time    Chest 2 Views [[id number]] Collected:  12/10/16 1126    Order Status:  Completed Updated:  12/10/16 1131    Narrative:       Frontal and lateral chest x-ray    Indication: Cough    Comparison: 02/02/16    Findings: Cardio mediastinal silhouette is within normal limits. Lungs  are clear. No pneumonia, vascular congestion, or pleural effusion.     Bones are unremarkable.      Impression:        No acute cardiopulmonary findings.    Lorinda Creed, MD   12/10/2016 11:27 AM      .  Medical Decision Making   I am the first provider for this patient.    I reviewed the vital signs, available nursing notes, past medical history, past surgical history, family history and social history.    Vital Signs-Reviewed the patient's vital signs.     Patient Vitals for the past 12 hrs:   BP Temp Pulse Resp   12/10/16 1030 138/83 - 98 -   12/10/16 1027 136/85 - 94 18   12/10/16 1019 (!) 138/91 98.9 F (37.2 C) 94 20       Pulse Oximetry Analysis - Normal 95% on RA    EKG:  Interpreted by the EP.   Time Interpreted: 10:15 AM   Rate:  93   Rhythm: Normal Sinus Rhythm    Interpretation:Normal axis, borderline LVH, nonspecific ST changes   Comparison: No prior study is available for comparison.    Old Medical Records: Nursing notes.     ED Course:     10:28 AM -  Discussed ED plan with the pt, she agrees with the plan.  Discussed CXR, medications, nebulizer, flu test, steroids, possible viral illness, UA, and nasal stray.  Requests inhaler refills.      11:43 AM - Discussed results, discharge Rx, discharge instructions, follow up instructions, and return precautions with the patient.  Solicited and answered all questions.  The patient agrees with the care plan and is amenable to discharge.    Provider Notes: 49F presented with a cough and congestion. C/w vital illness. CXR neg for PNA. EKG nonischemic. Very atypical for ACS or PE. Flu neg. Duonebs. Steroids. PO hydration. Improved on re-evaluation. Tolerated POs. Close followup. Discussed importance of f/u and indications for return.         Diagnosis     Clinical Impression:   1. Upper respiratory tract infection, unspecified type    2. Pleurisy        Treatment Plan:   ED Disposition     ED Disposition Condition Date/Time Comment    Discharge  Mon Dec 10, 2016 11:38 AM Candis Schatz discharge to home/self care.    Condition at disposition: Stable            _______________________________      Attestations: This note is prepared by Manus Gunning, acting as scribe for Burgess Estelle, MD.  - The scribe's documentation has been prepared under my direction and personally reviewed by me in its entirety.  I confirm that the note above accurately reflects all work, treatment, procedures, and medical decision making performed by me.    _______________________________     Roxine Caddy, MD  12/10/16 [phone]

## 2016-12-10 NOTE — Progress Notes (Signed)
Pre pf 160/488 33%pred post pf 260/488 53%pred

## 2016-12-10 NOTE — ED Triage Notes (Signed)
Wendy Preston is a 46 y.o. female who presents with cough/ chest discomfort x4 days. Pt self medicating at home with mucinex. No relief. BP 136/85   Pulse 92   Temp 98.9 F (37.2 C) (Oral)   Resp 18   Ht 5\' 8"  (1.727 m)   Wt 99.8 kg   SpO2 96%   BMI 33.45 kg/m

## 2016-12-10 NOTE — Discharge Instructions (Signed)
Dear Wendy Preston:     Thank you for choosing the [facility] for your healthcare needs. It was a privilege caring for you. We are genuinely concerned about your health and comfort.     Below is some information our patients often find helpful.    Instructions:     We recommend taking Tylenol as needed for discomfort.      It is critical that you return immediately or follow up with your doctor if your symptoms worsen, including:  You have worsening pain.  You are unable to keep food down.  You have fevers greater than 101.0 degrees.  You have any concerns.    We recommend checking GoodRx.com to find coupons to lower the cost of your prescriptions.    You will likely be receiving a survey about the care you received in the emergency department. It would mean a lot to our care team if you could fill that out and return the form. We're always looking to improve and your feedback will help [place] do that.    We wish you good health. Please do not hesitate to contact me if I can be of assistance. We aim to provide excellent care.     Sincerely,   Cloyd Stagers. Meryl Crutch, MD FACEP  [email or web address]  [facility]  [phone]        Pleurisy    You have been diagnosed with pleurisy.    Pleurisy is a swelling or irritation. It affects the chest cavity lining and lungs and causes sharp chest pain. The pain is worse when breathing in (inhaling).    There are other more serious problems that can cause pain with breathing, however. The doctor who saw you today believes your problem is not serious.    Treatment may include an anti-inflammatory medicine. If you were given a prescription for an anti-inflammatory medicine, do not take other over-the-counter (without prescription) anti-inflammatories like ibuprofen (Advil or Motrin) and naproxen (Naprosyn or Aleve).    Take deep breaths. This will keep your lungs expanded and help prevent partial lung collapse (atelectasis)  from developing. It will also prevent pneumonia.    YOU SHOULD SEEK MEDICAL ATTENTION IMMEDIATELY, EITHER HERE OR AT THE NEAREST EMERGENCY DEPARTMENT, IF ANY OF THE FOLLOWING OCCURS:   Trouble breathing or wheezing.   Coughing up blood.   Fever (temperature higher than 100.369F / 38C) or any fever that doesn't go away.   Chest pain.   No improvement in 48 to 72 hours or symptoms get worse.                 Upper Respiratory Infection    You have been diagnosed with a viral upper respiratory infection, often called a "URI."    The symptoms of a viral upper respiratory infection may include fever (temperature higher than 100.369F / 38C), runny nose, congestion and sinus fullness, facial pain, earache, sore throat, cough, and occasionally wheezing. The symptoms usually improve within 3 to 4 days. It might take up to 10 days before you feel completely better.    URI's are treated with fluids, rest, and medication for fever and pain. Sometimes decongestants, cough medications or-medications for wheezing can also help.    Antibiotics have NO effect whatsoever on viruses and ARE NOT needed for a URI. Taking antibiotics when they are not necessary can cause side effects (like diarrhea or allergic reactions). It can also cause resistance, meaning antibiotics won't work when you need them  in the future.    You do not need to follow up with a doctor unless you feel worse or have other concerns.    YOU SHOULD SEEK MEDICAL ATTENTION IMMEDIATELY, EITHER HERE OR AT THE NEAREST EMERGENCY DEPARTMENT, IF ANY OF THE FOLLOWING OCCURS:   You have new or worse symptoms or concerns.   You are short of breath.   You have a severe headache, stiff neck, confusion, or problems thinking.   You have nasal discharge, fever (temperature higher than 100.82F / 38C), or productive cough (one that brings up mucous from the lungs) lasting more that 10 days. Occasionally a viral cold may lead into a bacterial infection, such as a sinus  infection, ear infection or pneumonia. Taking antibiotics during the cold will NOT prevent these infections, but if a secondary infection occurs, you might require additional treatment.

## 2018-04-09 ENCOUNTER — Emergency Department: Payer: Self-pay

## 2018-04-09 ENCOUNTER — Emergency Department
Admission: EM | Admit: 2018-04-09 | Discharge: 2018-04-09 | Disposition: A | Discharge status: Home / Self Care | Payer: Self-pay | Attending: Emergency Medicine | Admitting: Emergency Medicine

## 2018-04-09 DIAGNOSIS — Z79899 Other long term (current) drug therapy: Secondary | ICD-10-CM | POA: Insufficient documentation

## 2018-04-09 DIAGNOSIS — R030 Elevated blood-pressure reading, without diagnosis of hypertension: Secondary | ICD-10-CM

## 2018-04-09 DIAGNOSIS — F17210 Nicotine dependence, cigarettes, uncomplicated: Secondary | ICD-10-CM | POA: Insufficient documentation

## 2018-04-09 DIAGNOSIS — I10 Essential (primary) hypertension: Secondary | ICD-10-CM | POA: Insufficient documentation

## 2018-04-09 DIAGNOSIS — R079 Chest pain, unspecified: Secondary | ICD-10-CM | POA: Insufficient documentation

## 2018-04-09 LAB — CBC AND DIFFERENTIAL
Absolute NRBC: 0 10*3/uL (ref 0.00–0.00)
Basophils Absolute Automated: 0.04 10*3/uL (ref 0.00–0.08)
Basophils Automated: 0.6 %
Eosinophils Absolute Automated: 0.15 10*3/uL (ref 0.00–0.44)
Eosinophils Automated: 2.1 %
Hematocrit: 36 % (ref 34.7–43.7)
Hgb: 11.6 g/dL (ref 11.4–14.8)
Immature Granulocytes Absolute: 0.03 10*3/uL (ref 0.00–0.07)
Immature Granulocytes: 0.4 %
Lymphocytes Absolute Automated: 2.97 10*3/uL (ref 0.42–3.22)
Lymphocytes Automated: 40.9 %
MCH: 26.5 pg (ref 25.1–33.5)
MCHC: 32.2 g/dL (ref 31.5–35.8)
MCV: 82.4 fL (ref 78.0–96.0)
MPV: 9.8 fL (ref 8.9–12.5)
Monocytes Absolute Automated: 0.46 10*3/uL (ref 0.21–0.85)
Monocytes: 6.3 %
Neutrophils Absolute: 3.61 10*3/uL (ref 1.10–6.33)
Neutrophils: 49.7 %
Nucleated RBC: 0 /100 WBC (ref 0.0–0.0)
Platelets: 358 10*3/uL — ABNORMAL HIGH (ref 142–346)
RBC: 4.37 10*6/uL (ref 3.90–5.10)
RDW: 17 % — ABNORMAL HIGH (ref 11–15)
WBC: 7.26 10*3/uL (ref 3.10–9.50)

## 2018-04-09 LAB — COMPREHENSIVE METABOLIC PANEL
ALT: 12 U/L (ref 0–55)
AST (SGOT): 17 U/L (ref 5–34)
Albumin/Globulin Ratio: 1.1 (ref 0.9–2.2)
Albumin: 4.1 g/dL (ref 3.5–5.0)
Alkaline Phosphatase: 67 U/L (ref 37–106)
Anion Gap: 9 (ref 5.0–15.0)
BUN: 12 mg/dL (ref 7.0–19.0)
Bilirubin, Total: 0.2 mg/dL (ref 0.2–1.2)
CO2: 24 mEq/L (ref 22–29)
Calcium: 10.3 mg/dL (ref 8.5–10.5)
Chloride: 103 mEq/L (ref 100–111)
Creatinine: 0.9 mg/dL (ref 0.6–1.0)
Globulin: 3.6 g/dL (ref 2.0–3.6)
Glucose: 102 mg/dL — ABNORMAL HIGH (ref 70–100)
Potassium: 3.9 mEq/L (ref 3.5–5.1)
Protein, Total: 7.7 g/dL (ref 6.0–8.3)
Sodium: 136 mEq/L (ref 136–145)

## 2018-04-09 LAB — URINE BHCG POC: Urine bHCG POC: NEGATIVE

## 2018-04-09 LAB — HEMOLYSIS INDEX: Hemolysis Index: 7 (ref 0–18)

## 2018-04-09 LAB — TROPONIN I: Troponin I: <0.01 ng/mL (ref 0.00–0.09)

## 2018-04-09 LAB — GFR: EGFR: >60

## 2018-04-09 MED ORDER — KETOROLAC TROMETHAMINE 30 MG/ML IJ SOLN
30.0000 mg | Freq: Once | INTRAMUSCULAR | Status: AC
Start: 2018-04-09 — End: 2018-04-09
  Administered 2018-04-09: 16:00:00 30 mg via INTRAVENOUS
  Filled 2018-04-09: qty 1

## 2018-04-09 MED ORDER — CYCLOBENZAPRINE HCL 10 MG PO TABS
10.0000 mg | ORAL_TABLET | Freq: Three times a day (TID) | ORAL | 0 refills | Status: DC | PRN
Start: 2018-04-09 — End: 2024-03-26

## 2018-04-09 NOTE — ED Provider Notes (Signed)
EMERGENCY DEPARTMENT HISTORY AND PHYSICAL EXAM     Physician/Midlevel provider first contact with patient: 04/09/18 1520         Date: 04/09/2018  Patient Name: Wendy Preston    History of Presenting Illness     Chief Complaint   Patient presents with   . Chest Pain   . Extremity Weakness     Right Arm Pain       History Provided By: Patient    Chief Complaint: CP  Duration: yesterday  Timing:  Constant  Location: peristernal  Quality: pressure  Severity: Moderate  Exacerbating factors: none  Alleviating factors: deep respiration  Associated Symptoms: R arm shaking (resolved)  Pertinent Negatives: leg swelling, SOB, n/v, no h/o blood clots    Additional History: Wendy Preston is a 47 y.o. female h/o asthma, HTN (on Losartan and HCTZ) presenting to the ED with constant peristernal pressure-like CP since yesterday. 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 Pain present during both rest and exertion. No exacerbating factors and feels some relief with deep respiration. Pt reports having lifted a heavy entertainment system 2 days before onset of CP and then had a tremulous sensation in her R arm after lifting, which then resolved spontaneously and hasn't returned.  Endorses cigarette use. No reported h/o blood clots or drug use. Not on daily aspirin. Denies leg swelling, SOB, or n/v.      PCP: Rana, Preeti, MD  SPECIALISTS:    No current facility-administered medications for this encounter.      Current Outpatient Prescriptions   Medication Sig Dispense Refill   . hydroCHLOROthiazide (HYDRODIURIL) 25 MG tablet Take 25 mg by mouth daily     . losartan (COZAAR) 25 MG tablet Take 25 mg by mouth daily     . amLODIPine (NORVASC) 10 MG tablet Take 1 tablet (10 mg total) by mouth daily. 30 tablet 0   . benzonatate (TESSALON) 100 MG capsule Take 1 capsule (100 mg total) by mouth 3 (three) times daily as needed for Cough.for up to 15 doses 15 capsule 0   . cyclobenzaprine (FLEXERIL) 10 MG tablet Take 1 tablet  (10 mg total) by mouth 3 (three) times daily as needed (pain) 15 tablet 0   . HYDROcodone-acetaminophen (NORCO) 5-325 MG per tablet Take 1 tablet by mouth every 6 (six) hours as needed (severe pain).for up to 10 doses 10 tablet 0   . ibuprofen (ADVIL,MOTRIN) 400 MG tablet Take 1 tablet (400 mg total) by mouth every 6 (six) hours as needed for Pain or Fever.for up to 30 doses 30 tablet 0   . medroxyPROGESTERone (PROVERA) 2.5 MG tablet Take 4 tablets (10 mg total) by mouth daily. 10 tablet 0   . medroxyPROGESTERone (PROVERA) 2.5 MG tablet Take 4 tablets (10 mg total) by mouth daily. 30 tablet 0   . oxymetazoline (AFRIN) 0.05 % nasal spray 2 sprays by Nasal route 2 (two) times daily. 15 mL 0       Past History     Past Medical History:  Past Medical History:   Diagnosis Date   . Asthma without status asthmaticus    . Hypertension        Past Surgical History:  Past Surgical History:   Procedure Laterality Date   . CESAREAN SECTION         Family History:  History reviewed. No pertinent family history.    Social History:  Social History   Substance Use Topics   .  Smoking status: Current Every Day Smoker     Packs/day: 2.00   . Smokeless tobacco: Current User   . Alcohol use Yes       Allergies:  Allergies   Allergen Reactions   . Penicillins        Review of Systems     Review of Systems   Constitutional: Negative for activity change, chills and fever.   HENT: Negative for congestion and sore throat.    Eyes: Negative for pain and redness.   Respiratory: Negative for cough and shortness of breath.    Cardiovascular: Positive for chest pain. Negative for palpitations.   Gastrointestinal: Negative for abdominal pain, diarrhea, nausea and vomiting.   Genitourinary: Negative for dysuria and hematuria.   Musculoskeletal: Negative for arthralgias and myalgias.   Skin: Negative for pallor and rash.   Neurological: Negative for light-headedness and headaches.   Psychiatric/Behavioral: Negative for suicidal ideas. The patient is  not nervous/anxious.    All other systems reviewed and are negative.      Physical Exam   BP (!) 160/98   Pulse 66   Temp 98 F (36.7 C) (Oral)   Resp 18   Ht 5\' 9"  (1.753 m)   Wt 95.3 kg   LMP 04/02/2018   SpO2 100%   BMI 31.01 kg/m     Physical Exam   Constitutional: She is oriented to person, place, and time. She appears well-developed and well-nourished.   HENT:   Head: Normocephalic and atraumatic.   Eyes: Pupils are equal, round, and reactive to light. EOM are normal.   Neck: Normal range of motion. Neck supple.   Cardiovascular: Normal rate, regular rhythm, normal heart sounds and intact distal pulses.    Pulmonary/Chest: Effort normal and breath sounds normal. She exhibits tenderness (anterior chest wall).   Abdominal: Soft. Bowel sounds are normal. There is no tenderness. There is no rebound.   Musculoskeletal: Normal range of motion. She exhibits no deformity.   Neurological: She is alert and oriented to person, place, and time. No cranial nerve deficit.   Skin: Skin is warm and dry. Capillary refill takes less than 2 seconds.   Psychiatric: She has a normal mood and affect. Her behavior is normal.   Nursing note and vitals reviewed.      Diagnostic Study Results     Labs -     Results     Procedure Component Value Units Date/Time    Troponin I [[id number]] Collected:  04/09/18 1501    Specimen:  Blood Updated:  04/09/18 1541     Troponin I <0.01 ng/mL     Comprehensive metabolic panel [[id number]]  (Abnormal) Collected:  04/09/18 1501    Specimen:  Blood Updated:  04/09/18 1535     Glucose 102 (H) mg/dL      BUN 78.2 mg/dL      Creatinine 0.9 mg/dL      Sodium 956 mEq/L      Potassium 3.9 mEq/L      Chloride 103 mEq/L      CO2 24 mEq/L      Calcium 10.3 mg/dL      Protein, Total 7.7 g/dL      Albumin 4.1 g/dL      AST (SGOT) 17 U/L      ALT 12 U/L      Alkaline Phosphatase 67 U/L      Bilirubin, Total 0.2 mg/dL      Globulin 3.6 g/dL  Albumin/Globulin Ratio 1.1     Anion Gap 9.0    Hemolysis  index [[id number]] Collected:  04/09/18 1501     Updated:  04/09/18 1535     Hemolysis Index 7    GFR [[id number]] Collected:  04/09/18 1501     Updated:  04/09/18 1535     EGFR >60.0    Urine BHCG POC [[id number]] Collected:  04/09/18 1509     Updated:  04/09/18 1516     Urine bHCG POC Negative    CBC with differential [[id number]]  (Abnormal) Collected:  04/09/18 1501    Specimen:  Blood from Blood Updated:  04/09/18 1516     WBC 7.26 x10 3/uL      Hgb 11.6 g/dL      Hematocrit 62.9 %      Platelets 358 (H) x10 3/uL      RBC 4.37 x10 6/uL      MCV 82.4 fL      MCH 26.5 pg      MCHC 32.2 g/dL      RDW 17 (H) %      MPV 9.8 fL      Neutrophils 49.7 %      Lymphocytes Automated 40.9 %      Monocytes 6.3 %      Eosinophils Automated 2.1 %      Basophils Automated 0.6 %      Immature Granulocyte 0.4 %      Nucleated RBC 0.0 /100 WBC      Neutrophils Absolute 3.61 x10 3/uL      Abs Lymph Automated 2.97 x10 3/uL      Abs Mono Automated 0.46 x10 3/uL      Abs Eos Automated 0.15 x10 3/uL      Absolute Baso Automated 0.04 x10 3/uL      Absolute Immature Granulocyte 0.03 x10 3/uL      Absolute NRBC 0.00 x10 3/uL           Radiologic Studies -   Radiology Results (24 Hour)     Procedure Component Value Units Date/Time    XR Chest  AP Portable [[id number]] Collected:  04/09/18 1537    Order Status:  Completed Updated:  04/09/18 1542    Narrative:       HISTORY: Chest pain.    TECHNIQUE: Single frontal view of the chest was obtained.     PRIORS: 12/10/2016.    FINDINGS: The lung fields are clear. There are no significant pleural  effusions. The cardiac silhouette and hila are normal. The trachea is  midline. The bony structures are essentially unremarkable.       Impression:        No  radiographic evidence of active lung disease.    Georgana Curio, MD   04/09/2018 3:37 PM      .    Medical Decision Making   I am the first provider for this patient.    I reviewed the vital signs, available nursing notes, past medical history, past  surgical history, family history and social history.    Vital Signs-Reviewed the patient's vital signs.     Patient Vitals for the past 12 hrs:   BP Temp Pulse Resp   04/09/18 1714 (!) 160/98 98 F (36.7 C) 66 18   04/09/18 1536 (!) 174/110 - 77 18   04/09/18 1430 (!) 173/95 99.1 F (37.3 C) 93 16       Pulse Oximetry Analysis - Normal 99% on  RA.    Cardiac Monitor:  Rate: 77  Rhythm:  Normal Sinus Rhythm     EKG:  Interpreted by the EP.   Time Interpreted: 1434   Rate: 85   Rhythm: Normal Sinus Rhythm    Interpretation: normal axis, normal intervals, no ST elevations or depressions. T-wave flattening in the lateral leads.    Clinical Decision Support:    Wells Score      Value   Previous DVT or PE  0   HR greater than 100  0   Surgery within 4 weeks, or Immobilization in last 3 days  0   Clinical Signs/Symptoms of DVT  0   Alternative diagnosis less likely than PE  0   Hemoptysis  0   Malignancy with treatment within 6 months or palliative care  0   Wells Score for PE  0      PERC Score      Value   Age (read only)  less than or equal to 50 years   HR >= 100  0   O2 Sat on Room Air < 95%  0   Prior History of Venous Thromboembolism  0   Trauma or Surgery within 4 weeks  0   Hemoptysis  0   Exogenous Estrogen  0   Unilateral Leg Swelling  0   PERC Rule Score (Calculated)  0      Heart Score      Value   History  0   EKG  0   Risk Factors  1   Total (with age)  2   Onset of pain (time of START of last episode of chest pain)?  >6 hrs ago          Old Medical Records: Nursing notes.     ED Course:   ED Course as of Apr 09 1717   Wed Apr 09, 2018   1718 BP improved to 160/98  [MA]      ED Course User Index  [MA] Nimrod Wendt, Rochel Brome, MD     5:05 PM - Pt updated on all results, starting to feel better. Discussed risks and benefits of observation overnight vs d/c, she wishes to go home. Counseled pt on her BP and will f/u with Dr. Marney Doctor, is agreeable. Says she is compliant with her HTN meds. Requests pain medicine  Rx.      Provider Notes: chest pain, likely msk. Doubt pe or ACS. Do not suspect dissection as pt is very comfortable appearing.   -ekg  -cxr  -labs  -pain control       Diagnosis     Clinical Impression:   1. Chest pain, unspecified type    2. Elevated blood pressure reading        Treatment Plan:   ED Disposition     ED Disposition Condition Date/Time Comment    Discharge  Wed Apr 09, 2018  5:08 PM Candis Schatz discharge to home/self care.    Condition at disposition: Stable            _______________________________      Attestations: This note is prepared by Can Ozyildirim, acting as scribe for Freda Jackson, MD.    Freda Jackson, MD - The scribe's documentation has been prepared under my direction and personally reviewed by me in its entirety.  I confirm that the note above accurately reflects all work, treatment, procedures, and medical decision making performed by me.    _______________________________  Darcus Pester, MD  04/09/18 [phone]

## 2018-04-09 NOTE — ED Triage Notes (Signed)
.  Wendy Preston is a 47 y.o. female c/o constant center chest pressure x 1 day; pt was at rest when pressure.  Pt denies N/V/D/GU/GI symptoms.  Pt c/o right sided arm weakness & shaking on Saturday but since then has resolved & pt stated she was lifting heavy tv to dumpster.

## 2018-04-09 NOTE — Discharge Instructions (Signed)
Chest Pain of Unclear Etiology    You have been seen for chest pain. The cause of your pain is not yet known.    Your doctor has learned about your medical history, examined you, and checked any tests that were done. Still, it is unclear why you are having pain. The doctor thinks there is only a very small chance that your pain is caused by a life-threatening condition. Later, your primary care doctor might do more tests or check you again.    Sometimes chest pain is caused by a dangerous condition, like a heart attack, aorta injury, blood clot in the lung, or collapsed lung. It is unlikely that your pain is caused by a life-threatening condition if: Your chest pain lasts only a few seconds at a time; you are not short of breath, nauseated (sick to your stomach), sweaty, or lightheaded; your pain gets worse when you twist or bend; your pain improves with exercise or hard work.    Chest pain is serious. It is VERY IMPORTANT that you follow up with your regular doctor and seek medical attention immediately here or at the nearest Emergency Department if your symptoms become worse or they change.    YOU SHOULD SEEK MEDICAL ATTENTION IMMEDIATELY, EITHER HERE OR AT THE NEAREST EMERGENCY DEPARTMENT, IF ANY OF THE FOLLOWING OCCURS:   Your pain gets worse.   Your pain makes you short of breath, nauseated, or sweaty.   Your pain gets worse when you walk, go up stairs, or exert yourself.   You feel weak, lightheaded, or faint.   It hurts to breathe.   Your leg swells.   Your symptoms get worse or you have new symptoms or concerns.            Elevated Blood Pressure    During your visit today your blood pressure was higher than normal.    Check your blood pressure several times over the next several days, then follow up with your regular doctor. If you do not have a doctor, ask the medical staff to refer you to one.    You may need medication for your blood pressure if it stays high. Untreated high blood pressure  can cause damage to your heart and kidneys and may lead to a heart attack or stroke. It is VERY IMPORTANT to follow up with your doctor.   Check your blood pressure daily and follow up with your doctor.   A doctor will diagnose high blood pressure only if your blood pressure is high for several days. Many pharmacies have machines that let you check your own blood pressure. You can also check with a fire station to see whether a paramedic will take your blood pressure. Another option is to purchase a blood pressure monitor to use at home. These are available at most pharmacies.     YOU SHOULD SEEK MEDICAL ATTENTION IMMEDIATELY, EITHER HERE OR AT THE NEAREST EMERGENCY DEPARTMENT, IF ANY OF THE FOLLOWING OCCURS:   You have a sudden or severe headache.   You are numb, tingly, or weak on one side of your body, half of your face droops, or you have trouble speaking.   You have chest pain.   You are short of breath.

## 2018-04-11 LAB — ECG 12-LEAD
Atrial Rate: 85 {beats}/min
P Axis: 62 degrees
P-R Interval: 142 ms
Q-T Interval: 368 ms
QRS Duration: 84 ms
QTC Calculation (Bezet): 437 ms
R Axis: 34 degrees
T Axis: 47 degrees
Ventricular Rate: 85 {beats}/min

## 2019-04-28 ENCOUNTER — Other Ambulatory Visit: Payer: Self-pay

## 2019-04-28 ENCOUNTER — Emergency Department (HOSPITAL_COMMUNITY)
Admission: EM | Admit: 2019-04-28 | Discharge: 2019-04-28 | Discharge status: Against Medical Advice | Payer: Medicaid - Out of State | Attending: Emergency Medicine | Admitting: Emergency Medicine

## 2019-04-28 ENCOUNTER — Encounter (HOSPITAL_COMMUNITY): Payer: Self-pay | Admitting: Emergency Medicine

## 2019-04-28 DIAGNOSIS — Z5321 Procedure and treatment not carried out due to patient leaving prior to being seen by health care provider: Secondary | ICD-10-CM | POA: Insufficient documentation

## 2019-04-28 DIAGNOSIS — R110 Nausea: Secondary | ICD-10-CM | POA: Diagnosis present

## 2019-04-28 LAB — COMPREHENSIVE METABOLIC PANEL
ALT: 27 U/L (ref 0–44)
AST: 22 U/L (ref 15–41)
Albumin: 4 g/dL (ref 3.5–5.0)
Alkaline Phosphatase: 75 U/L (ref 38–126)
Anion gap: 12 (ref 5–15)
BUN: 9 mg/dL (ref 6–20)
CO2: 23 mmol/L (ref 22–32)
Calcium: 10.4 mg/dL — ABNORMAL HIGH (ref 8.9–10.3)
Chloride: 101 mmol/L (ref 98–111)
Creatinine, Ser: 0.77 mg/dL (ref 0.44–1.00)
GFR calc Af Amer: >60 mL/min (ref >60)
GFR calc non Af Amer: >60 mL/min (ref >60)
Glucose, Bld: 96 mg/dL (ref 70–99)
Potassium: 3.3 mmol/L — ABNORMAL LOW (ref 3.5–5.1)
Sodium: 136 mmol/L (ref 135–145)
Total Bilirubin: 0.7 mg/dL (ref 0.3–1.2)
Total Protein: 7.6 g/dL (ref 6.5–8.1)

## 2019-04-28 LAB — LIPASE, BLOOD: Lipase: 29 U/L (ref 11–51)

## 2019-04-28 LAB — URINALYSIS, ROUTINE W REFLEX MICROSCOPIC
Bilirubin Urine: NEGATIVE
Glucose, UA: NEGATIVE mg/dL
Hgb urine dipstick: NEGATIVE
Ketones, ur: 5 mg/dL — AB
Leukocytes,Ua: NEGATIVE
Nitrite: NEGATIVE
Protein, ur: NEGATIVE mg/dL
Specific Gravity, Urine: 1.009 (ref 1.005–1.030)
pH: 6 (ref 5.0–8.0)

## 2019-04-28 LAB — CBC
HCT: 39 % (ref 36.0–46.0)
Hemoglobin: 12.6 g/dL (ref 12.0–15.0)
MCH: 26.9 pg (ref 26.0–34.0)
MCHC: 32.3 g/dL (ref 30.0–36.0)
MCV: 83.3 fL (ref 80.0–100.0)
Platelets: 344 10*3/uL (ref 150–400)
RBC: 4.68 MIL/uL (ref 3.87–5.11)
RDW: 16.5 % — ABNORMAL HIGH (ref 11.5–15.5)
WBC: 6.7 10*3/uL (ref 4.0–10.5)
nRBC: 0 % (ref 0.0–0.2)

## 2019-04-28 LAB — I-STAT BETA HCG BLOOD, ED (MC, WL, AP ONLY): I-stat hCG, quantitative: <5 m[IU]/mL (ref <5)

## 2019-04-28 MED ORDER — SODIUM CHLORIDE 0.9% FLUSH: 3.0000 mL | Freq: Once | INTRAVENOUS | Status: DC

## 2019-04-28 NOTE — ED Notes (Signed)
Pt stated that she didn't want to wait anymore and was going home.

## 2019-04-28 NOTE — ED Triage Notes (Signed)
Pt states for 1 week she has been having nausea with chills and breaking out into a sweat. Pt denies any pain, no cough or sob.

## 2019-07-11 ENCOUNTER — Emergency Department
Admission: EM | Admit: 2019-07-11 | Discharge: 2019-07-11 | Disposition: A | Discharge status: Home / Self Care | Payer: Self-pay | Attending: Emergency Medicine | Admitting: Emergency Medicine

## 2019-07-11 ENCOUNTER — Emergency Department: Payer: Self-pay

## 2019-07-11 DIAGNOSIS — X501XXA Overexertion from prolonged static or awkward postures, initial encounter: Secondary | ICD-10-CM | POA: Insufficient documentation

## 2019-07-11 DIAGNOSIS — F17220 Nicotine dependence, chewing tobacco, uncomplicated: Secondary | ICD-10-CM | POA: Insufficient documentation

## 2019-07-11 DIAGNOSIS — S93602A Unspecified sprain of left foot, initial encounter: Secondary | ICD-10-CM | POA: Insufficient documentation

## 2019-07-11 DIAGNOSIS — I10 Essential (primary) hypertension: Secondary | ICD-10-CM | POA: Insufficient documentation

## 2019-07-11 MED ORDER — HYDROCODONE-ACETAMINOPHEN 5-325 MG PO TABS
1.0000 | ORAL_TABLET | Freq: Once | ORAL | Status: AC
Start: 2019-07-11 — End: 2019-07-11
  Administered 2019-07-11: 18:00:00 1 via ORAL
  Filled 2019-07-11: qty 1

## 2019-07-11 MED ORDER — NAPROXEN 500 MG PO TABS
500.0000 mg | ORAL_TABLET | Freq: Two times a day (BID) | ORAL | 0 refills | Status: DC | PRN
Start: 2019-07-11 — End: 2024-03-26

## 2019-07-11 MED ORDER — NAPROXEN 250 MG PO TABS
250.0000 mg | ORAL_TABLET | Freq: Once | ORAL | Status: AC
Start: 2019-07-11 — End: 2019-07-11
  Administered 2019-07-11: 18:00:00 250 mg via ORAL
  Filled 2019-07-11: qty 1

## 2019-07-11 MED ORDER — HYDROCODONE-ACETAMINOPHEN 5-325 MG PO TABS
1.00 | ORAL_TABLET | Freq: Four times a day (QID) | ORAL | 0 refills | Status: AC | PRN
Start: 2019-07-11 — End: 2019-07-18

## 2019-07-11 NOTE — ED Provider Notes (Signed)
EMERGENCY DEPARTMENT HISTORY AND PHYSICAL EXAM     None        Date: 07/11/2019  Patient Name: Wendy Preston      Personal Protective Equipment (PPE)    Goggles, Procedure Mask and Surgical / [employer]ffant Cap     History of Presenting Illness     Dragon voice recognition system was used for dictation. This note may contain inadvertent typos.      Chief Complaint   Patient presents with    Ankle Pain       History Provided By: Patient    Chief Complaint: L ankle injury    Additional History: Wendy Preston is a 48 y.o. female h/o HTN presenting to the ED c/o constant worsening throbbing L ankle pain after an inversion & eversion L ankle injury ~ 2p while wearing crocs. She had pain immediately after the injury, but she was able to walk on it. She took Aleve ~ 3p w/o pain relief. Pain is now worse and she is unable to walk. Pain is worse left midfoot and left lateral foot.    PCP: Burr Medico, MD  SPECIALISTS:    No current facility-administered medications for this encounter.      Current Outpatient Medications   Medication Sig Dispense Refill    hydroCHLOROthiazide (HYDRODIURIL) 25 MG tablet Take 25 mg by mouth daily      losartan (COZAAR) 25 MG tablet Take 25 mg by mouth daily      amLODIPine (NORVASC) 10 MG tablet Take 1 tablet (10 mg total) by mouth daily. 30 tablet 0    benzonatate (TESSALON) 100 MG capsule Take 1 capsule (100 mg total) by mouth 3 (three) times daily as needed for Cough.for up to 15 doses 15 capsule 0    cyclobenzaprine (FLEXERIL) 10 MG tablet Take 1 tablet (10 mg total) by mouth 3 (three) times daily as needed (pain) 15 tablet 0    HYDROcodone-acetaminophen (NORCO) 5-325 MG per tablet Take 1 tablet by mouth every 6 (six) hours as needed for Pain (severe) 12 tablet 0    ibuprofen (ADVIL,MOTRIN) 400 MG tablet Take 1 tablet (400 mg total) by mouth every 6 (six) hours as needed for Pain or Fever.for up to 30 doses 30 tablet 0    medroxyPROGESTERone (PROVERA) 2.5 MG tablet Take 4 tablets (10  mg total) by mouth daily. 10 tablet 0    medroxyPROGESTERone (PROVERA) 2.5 MG tablet Take 4 tablets (10 mg total) by mouth daily. 30 tablet 0    naproxen (NAPROSYN) 500 MG tablet Take 1 tablet (500 mg total) by mouth every 12 (twelve) hours as needed (pain, swelling, take w/ food) 30 tablet 0    oxymetazoline (AFRIN) 0.05 % nasal spray 2 sprays by Nasal route 2 (two) times daily. 15 mL 0         Past History     Reviewed Past Medical History, Surgical History, Family History and Social as documented.    Past Medical History:  Past Medical History:   Diagnosis Date    Asthma without status asthmaticus     Hypertension        Past Surgical History:  Past Surgical History:   Procedure Laterality Date    CESAREAN SECTION         Family History:  History reviewed. No pertinent family history.    Social History:  Social History     Tobacco Use    Smoking status: Former Smoker    Smokeless tobacco:  Current User   Substance Use Topics    Alcohol use: Yes    Drug use: No       Allergies:  Allergies   Allergen Reactions    Penicillins        Review of Systems     Review of Systems   Constitutional: Negative for fever.   Musculoskeletal: Positive for arthralgias, gait problem and joint swelling.        Positive for ankle pain, foot pain.   Skin: Negative for color change.   All other systems reviewed and are negative.      Physical Exam   BP 141/89    Pulse 61    Temp 98 F (36.7 C)    Resp 18    Ht 5\' 8"  (1.727 m)    Wt 97.1 kg    SpO2 100%    BMI 32.54 kg/m     Physical Exam   Constitutional: She is oriented to person, place, and time. She appears well-developed and well-nourished. She appears distressed (moderate).   Elevated BMI   HENT:   Head: Normocephalic and atraumatic.   Eyes: Conjunctivae are normal. Right eye exhibits no discharge. Left eye exhibits no discharge. No scleral icterus.   Neck: Neck supple. No JVD present. No tracheal deviation present.   Cardiovascular: Normal rate and intact distal  pulses.   1+ L DP pulse     Pulmonary/Chest: No stridor. No respiratory distress.   Speaking full sentences, no accessory muscle use   Musculoskeletal:         General: Tenderness and edema present.      Left ankle: She exhibits swelling (L lateral malleolus). Tenderness. Head of 5th metatarsal tenderness found. No lateral malleolus and no medial malleolus tenderness found.        Feet:       Comments: TTP dorsum L foot & L lateral food. Not red, not warm, no ecchymosis. + mild swelling. Pain w/ moving L toes. No cyanosis of toes   Neurological: She is alert and oriented to person, place, and time. She exhibits normal muscle tone. Coordination normal.   Skin: Skin is warm and dry. She is not diaphoretic. No pallor.   Psychiatric: She has a normal mood and affect. Her behavior is normal.   Nursing note and vitals reviewed.      Diagnostic Study Results     Labs -     Results     ** No results found for the last 24 hours. **          Radiologic Studies -   Radiology Results (24 Hour)     Procedure Component Value Units Date/Time    Ankle Left 3+ Views [[id number]] Collected: 07/11/19 1830    Order Status: Completed Updated: 07/11/19 1833    Narrative:      Clinical History:  Pain status post injury.    Examination:  XR ANKLE LEFT 3+ VIEWS    Comparison:  None available.    Findings:    No acute fracture detected. No dislocation. The soft tissues appear  unremarkable.      Impression:          No acute findings.    Carleene Overlie, MD   07/11/2019 6:31 PM    Foot Left AP Lateral and Oblique [[id number]] Collected: 07/11/19 1759    Order Status: Completed Updated: 07/11/19 1802    Narrative:      LEFT FOOT: 3 views    CLINICAL STATEMENT:  Left midfoot/lateral foot pain & swelling R/O Fx     COMPARISON:  No prior studies are available for comparison.    FINDINGS: There is no acute fracture or dislocation. There is a small  plantar calcaneal heel spur. The soft tissues are unremarkable. No soft  tissue gas is present.       Impression:        No acute fracture.    Fonnie Mu, DO   07/11/2019 6:00 PM      .    Medical Decision Making   I am the first provider for this patient.    I reviewed the vital signs, available nursing notes, past medical history, past surgical history, family history and social history.    Vital Signs-Reviewed the patient's vital signs.  I reviewed pt's pulse oxymetry and cardiac monitor values, as relevant to the case.     Patient Vitals for the past 12 hrs:   BP Temp Pulse Resp   07/11/19 1852 141/89 -- 61 18   07/11/19 1655 (!) 184/103 98 F (36.7 C) 96 20       Old Medical Records: Nursing notes.     ED Course:     Discussed results with pt and counseled on the diagnosis, f/u plans, medication use, and signs and symptoms when to return to ED. Given instructions for ace wrap, shoe cast, and ice. WBAT w/ crutches, keep bp log. F/U w/ PCP and podiatry. Pt is stable and ready for discharge. Possibility of evolving illness reviewed. All questions solicited and addressed.    ED Medications given:     Medications   naproxen (NAPROSYN) tablet 250 mg (250 mg Oral Given 07/11/19 1737)   HYDROcodone-acetaminophen (NORCO) 5-325 MG per tablet 1 tablet (1 tablet Oral Given 07/11/19 1738)        Medical Decision Making: Patient status post inversion and eversion injury today.  Now unable to walk.  Rule out fracture.  Treat pain.      Diagnosis     Clinical Impression:   1. Foot sprain, left, initial encounter    2. Essential hypertension        Treatment Plan:   ED Disposition     ED Disposition Condition Date/Time Comment    Discharge  Sat Jul 11, 2019  6:44 PM Wendy Preston discharge to home/self care.    Condition at disposition: Stable            _______________________________    *This note was generated by the Epic EMR system/ Dragon speech recognition and may contain inherent errors or omissions not intended by the user. Grammatical errors, random word insertions, deletions, pronoun errors and incomplete sentences  are occasional consequences of this technology due to software limitations. Not all errors are caught or corrected. If there are questions or concerns about the content of this note or information contained within the body of this dictation they should be addressed directly with the author for clarification.     Annett Fabian, MD  07/14/19 [phone]

## 2019-07-11 NOTE — ED Triage Notes (Signed)
Pt reports L ankle pain after twisting it while walking today around 1400. Pulses intact, no deformity. Naproxen taken at 1400.

## 2019-07-11 NOTE — Discharge Instructions (Signed)
Hypertension, Exacerbation    You have been previously diagnosed with hypertension. Hypertension means high blood pressure that happens every day. To be diagnosed with hypertension, the blood pressure readings must be abnormally high at least 3 different times. There are causes for high blood pressure that doctors can find. These include being overweight or having a kidney or hormone problem. This is called secondary hypertension. When a doctor does not know the cause, it is called essential hypertension. Both types of hypertension may require medicine to lower the blood pressure. Some people with high blood pressure will improve if they limit the sodium (salt) in their diets.    High blood pressure often has no symptoms. However, it can cause headaches or vision problems. In rare cases, very high blood pressure can cause seizures (fits). It is important to diagnose and treat hypertension even if there are no symptoms. This is because high blood pressure can cause damage to organs like the heart and kidneys. This damage can be permanent (not go away). That is why it is very important to take all medicines prescribed for your condition.    See your doctor in the next 24 hours.     YOU SHOULD SEEK MEDICAL ATTENTION IMMEDIATELY, EITHER HERE OR AT THE NEAREST EMERGENCY DEPARTMENT IF ANY OF THE FOLLOWING OCCUR:   Your chest hurts or you have trouble breathing.   You have a severe headache or have trouble seeing.   You have seizures.   You vomit (throw up) repeatedly or get more ill.     General Sprain    You were diagnosed with a sprain.     A sprain is a ligament injury, usually a tear or partial tear. Sprains can hurt as much as broken bones. There are different degrees of injury. A first-degree sprain is a minor tear. A second-degree sprain is a partial tear of the ligament. A third-degree sprain often involves a small fracture (break) of the bone that the ligament is attached to.    The most common  joints affected by sprains are the ankles, then the wrists. However, any joint can be affected. Most commonly, patients will have pain (often worse with moving the joint), swelling, and bruising. In rare cases, the entire ligament can be torn through. In this case, the patient will not be able to move the joint at all. This is because the muscle is no longer connected to the bone.     While you were here, you probably had some imaging done. This may have included an x-ray, a CT scan or an MRI.     At this point, your doctors think you are safe to go home.     Though we dont believe your condition is dangerous right now, it is important to be careful. Sometimes a problem that seems mild can become serious later. This is why it is very important that you return here or go to the nearest Emergency Department if you are not improving or your symptoms are getting worse.    Take all medicines as prescribed. Your doctor may prescribe you pain medications to treat yourpain. You can also use over-the-counter medicines like acetaminophen (Tylenol) or anti-inflammatory medicinelike ibuprofen (Advil, Motrin) or Naproxen (Aleve, Naprosyn). It is important to follow the directions for taking these medications.    Keep all your doctor's appointments. Following up with your doctor or the referral doctor is very important.    Some things you can do to help your injury are: Resting,  Icing, Compressing and Elevating the injured area. Remember this as "RICE."     REST: Limit the use of the painful body part.     ICE: By applying ice to the affected area, swelling and pain can be reduced. Place some ice cubes in a re-sealable (Ziploc) bag and add some water. Put a thin washcloth between the bag and the skin. Apply the ice bag to the area for at least 20 minutes. Do this at least 4 times per day. It is okay to do this more often than directed. You can also do it for longer than directed. NEVER APPLY ICE DIRECTLY TO  THE SKIN.     COMPRESS: Compression means to apply pressure around the painful area such as with a splint, cast or an ace bandage. Compression decreases swelling and improves comfort. Compression should be tight enough to relieve swelling but not so tight as to decrease circulation. Increasing pain, numbness, tingling, or change in skin color, are all signs of decreased circulation.     ELEVATE: Elevate the painful part.    YOU SHOULD SEEK MEDICAL ATTENTION IMMEDIATELY, EITHER HERE OR AT THE NEAREST EMERGENCY DEPARTMENT, IF ANY OF THE FOLLOWING OCCUR:     There is redness, warmth, or severe swelling in the joint space. This is especially important if you get a fever (temperature higher than 100.49F or 38C) or chills.   Your pain gets much worse.   Your ankle or foot starts to tingle or it gets numb.   Your foot is cold or pale. This might mean there is a problem with circulation (blood supply).    If you can't follow up with your doctor, or if at any time you feel you need to be rechecked or seen again, come back here or go to the nearest emergency department.

## 2019-11-19 ENCOUNTER — Other Ambulatory Visit: Payer: Self-pay | Admitting: Endocrinology

## 2019-11-19 DIAGNOSIS — Z1231 Encounter for screening mammogram for malignant neoplasm of breast: Secondary | ICD-10-CM

## 2019-11-30 ENCOUNTER — Encounter: Payer: Self-pay | Admitting: Obstetrics and Gynecology

## 2019-12-01 ENCOUNTER — Ambulatory Visit: Payer: Medicaid - Out of State

## 2020-01-18 ENCOUNTER — Other Ambulatory Visit: Payer: Self-pay

## 2020-01-19 ENCOUNTER — Encounter: Payer: Self-pay | Admitting: Obstetrics and Gynecology

## 2020-01-19 ENCOUNTER — Ambulatory Visit: Payer: 59 | Admitting: Obstetrics and Gynecology

## 2020-01-19 ENCOUNTER — Other Ambulatory Visit (HOSPITAL_COMMUNITY)
Admission: RE | Admit: 2020-01-19 | Discharge: 2020-01-19 | Disposition: A | Discharge status: Home / Self Care | Payer: 59 | Source: Ambulatory Visit | Attending: Obstetrics and Gynecology | Admitting: Obstetrics and Gynecology

## 2020-01-19 ENCOUNTER — Ambulatory Visit
Admission: RE | Admit: 2020-01-19 | Discharge: 2020-01-19 | Disposition: A | Discharge status: Home / Self Care | Payer: 59 | Source: Ambulatory Visit | Attending: Endocrinology | Admitting: Endocrinology

## 2020-01-19 VITALS — BP 130/74 | HR 80 | Temp 98.4°F | Ht 67.5 in | Wt 245.0 lb

## 2020-01-19 DIAGNOSIS — B977 Papillomavirus as the cause of diseases classified elsewhere: Secondary | ICD-10-CM

## 2020-01-19 DIAGNOSIS — R87612 Low grade squamous intraepithelial lesion on cytologic smear of cervix (LGSIL): Secondary | ICD-10-CM | POA: Diagnosis present

## 2020-01-19 DIAGNOSIS — Z1231 Encounter for screening mammogram for malignant neoplasm of breast: Secondary | ICD-10-CM

## 2020-01-19 NOTE — Progress Notes (Signed)
49 y.o. [id number] Single Black or African American Not Hispanic or Latino female here for a consultation from [facility] for LSIL, +HPV.  She reports a h/o atypical paps, never had to have a colposcopy.     Patient's last menstrual period was 01/17/2020.          Sexually active: Yes.   Female Partner The current method of family planning is none.    Exercising: No.  The patient does not participate in regular exercise at present. Smoker:  yes  Health Maintenance: Pap:  11/30/19 History of abnormal Pap:  yes MMG:  08/2018 normal  scheduled for today BMD:   Never  Colonoscopy: Never TDaP:  6 years ago  Gardasil: no   reports that she has been smoking cigars. She has been smoking about 0.25 packs per day. She has never used smokeless tobacco. She reports that she does not use drugs. 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 4 kids, 49, 16, 78, and 66. 8 grand kids. Everyone is local.   Past Medical History:  Diagnosis Date  . Hypertension     Past Surgical History:  Procedure Laterality Date  . CESAREAN SECTION      Current Outpatient Medications  Medication Sig Dispense Refill  . albuterol (VENTOLIN HFA) 108 (90 Base) MCG/ACT inhaler     . amLODipine (NORVASC) 5 MG tablet Take 5 mg by mouth daily.    Marland Kitchen losartan-hydrochlorothiazide (HYZAAR) 100-25 MG tablet Take 1 tablet by mouth daily.     No current facility-administered medications for this visit.    Family History  Problem Relation Age of Onset  . Cancer Mother 73       GYN cancer (?cervical), bladder cancer later  . Breast cancer Maternal Aunt 60  . Cancer Maternal Uncle        prostate   . Diabetes Paternal Grandmother   . Heart failure Paternal 18   Mom was in her 80's with her gyn cancer, thinks cervical.  MAunt was in her mid 98's with breast cancer.   Review of Systems  All other systems reviewed and are negative.   Exam:   BP 130/74   Pulse 80    Temp 98.4 F (36.9 C)   Ht 5' 7.5" (1.715 m)   Wt 245 lb (111.1 kg)   LMP 01/17/2020 Comment: just spots   SpO2 96%   BMI 37.81 kg/m   Weight change: @WEIGHTCHANGE @ Height:   Height: 5' 7.5" (171.5 cm)  Ht Readings from Last 3 Encounters:  01/19/20 5' 7.5" (1.715 m)    General appearance: alert, cooperative and appears stated age   Pelvic: External genitalia:  no lesions              Urethra:  normal appearing urethra with no masses, tenderness or lesions              Bartholins and Skenes: normal                 Vagina: normal appearing vagina with normal color and discharge, no lesions              Cervix: no lesions               Colposcopy: unsatisfactory, one spot with aceto-white changes at 4 o'clock, biopsy done. ECC done. Silver nitrate used for hemostasis. Negative lugols examination of her upper vagina.   Gae Dry chaperoned for the exam.  A:  LSIL  pap with +HPV. Discussed abnormal paps, HPV.  P:   Colposcopy with biopsy and ECC  Further plan depending on results.   CC: Newton Pigg, FNP Note sent

## 2020-01-19 NOTE — Patient Instructions (Signed)

## 2020-01-20 LAB — SURGICAL PATHOLOGY

## 2020-01-21 ENCOUNTER — Telehealth: Payer: Self-pay

## 2020-01-21 NOTE — Telephone Encounter (Signed)
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 She needs a f/u pap and ECC with her primary next year. Please send a copy of the results and this note to her primary, Newton Pigg, NP (and enter her into the system as her primary)

## 2020-01-21 NOTE — Telephone Encounter (Signed)
Spoke to pt. Pt given results and recommendations per Dr Oscar La. Pt is agreeable and verbalized understanding. Will send results to PCP listed. Updated Epic for PCP provider. Pt requesting a copy of results to be mailed to her. Address verified. Will mail copy today.   Routing to Dr Oscar La for review.  Encounter closed.

## 2020-11-15 IMAGING — MG DIGITAL SCREENING BILAT W/ TOMO W/ CAD
8 series · 8 of 24 positions shown · non-contrast
Comparison: None.

CLINICAL DATA: Screening. This is the patient's initial baseline
mammogram.

EXAM:
DIGITAL SCREENING BILATERAL MAMMOGRAM WITH TOMO AND CAD

[R CC synth-2D]
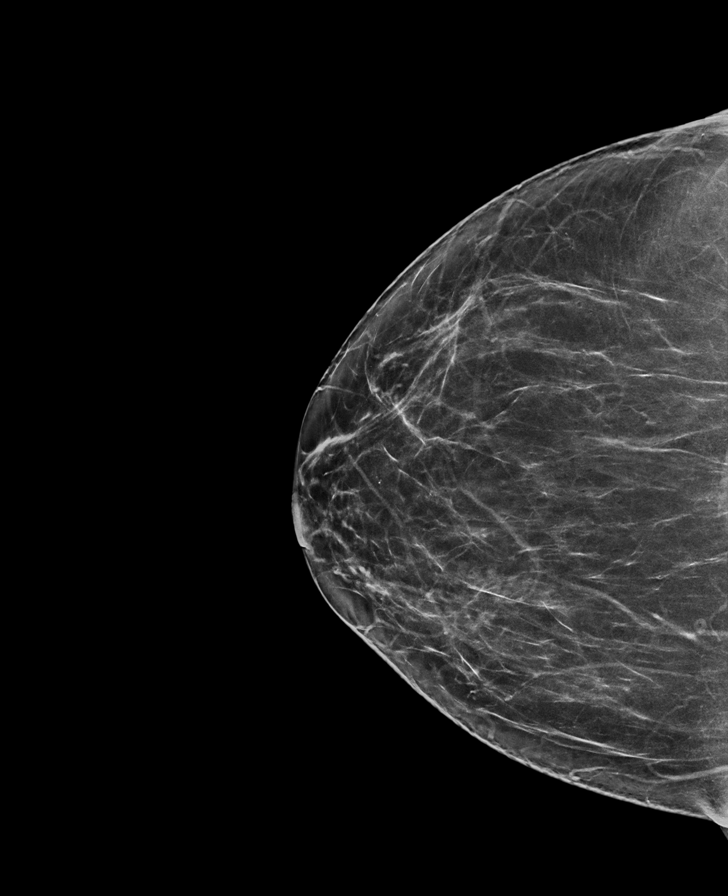

[L CC synth-2D]
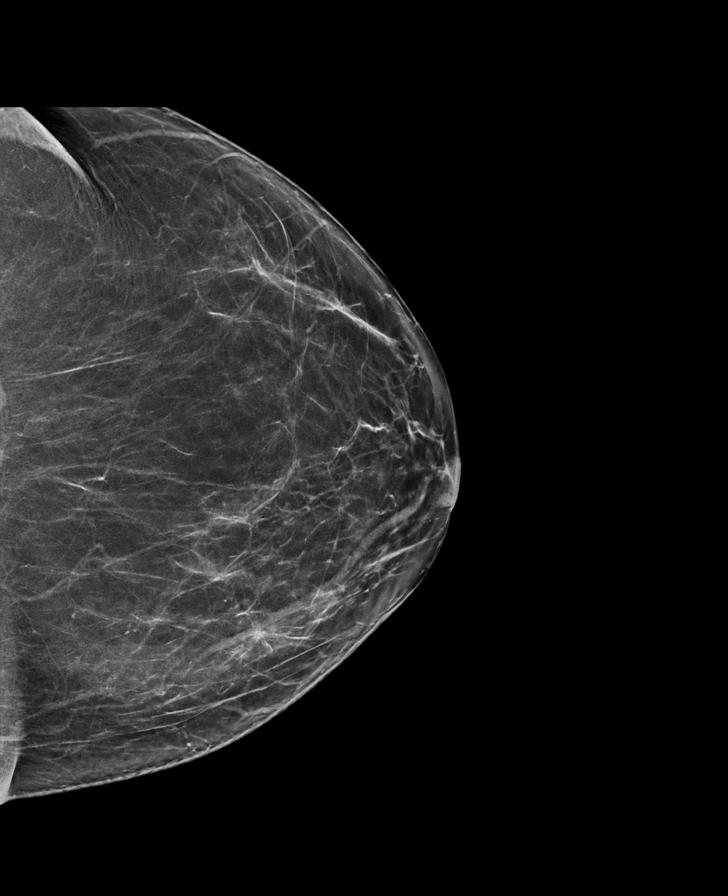

[L MLO synth-2D]
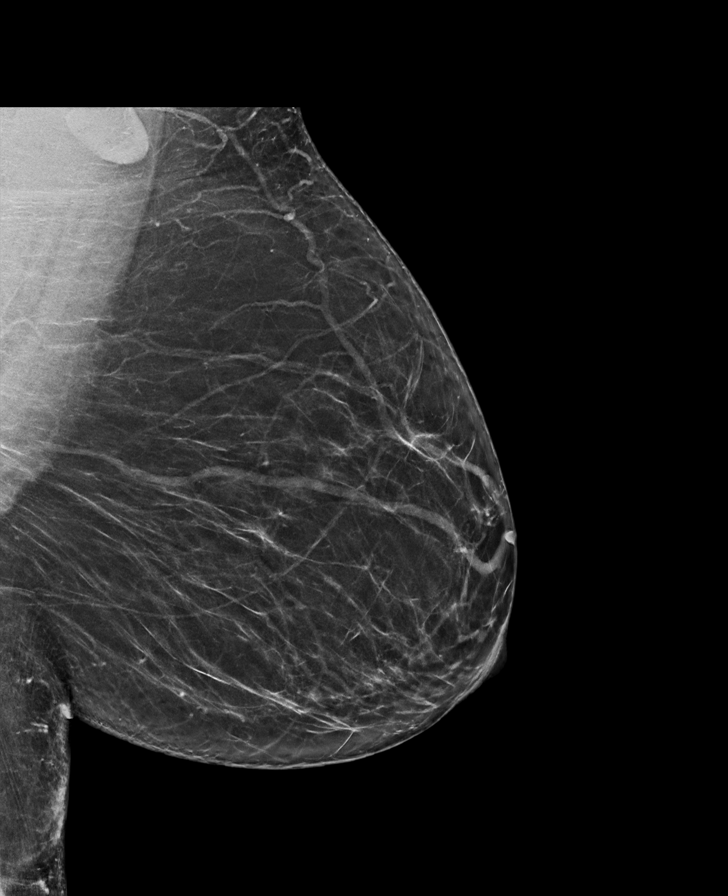

[R MLO synth-2D]
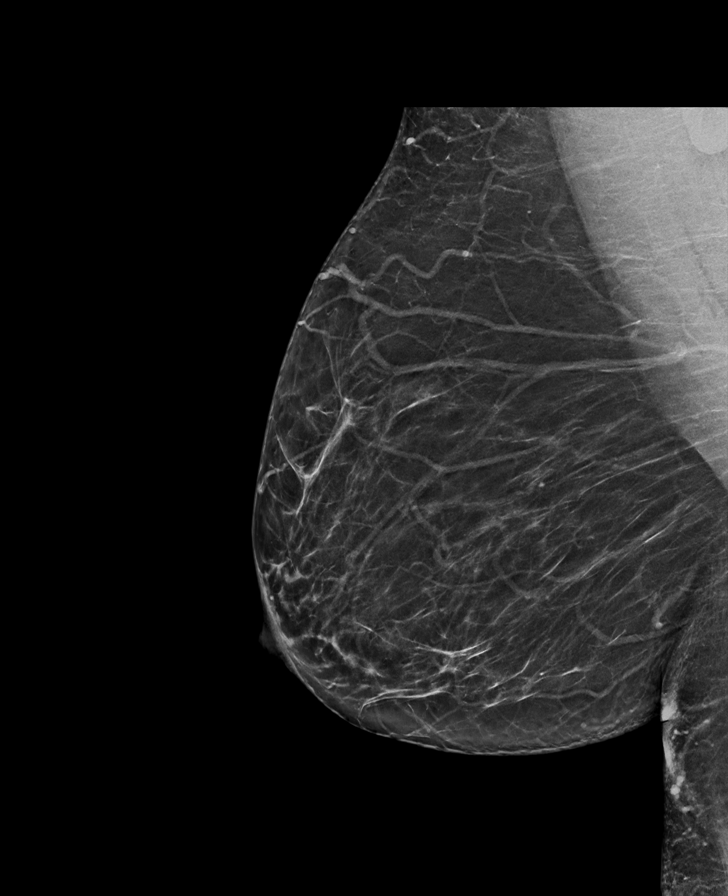

[L MLO tomo · tomo slice 35/70.0]
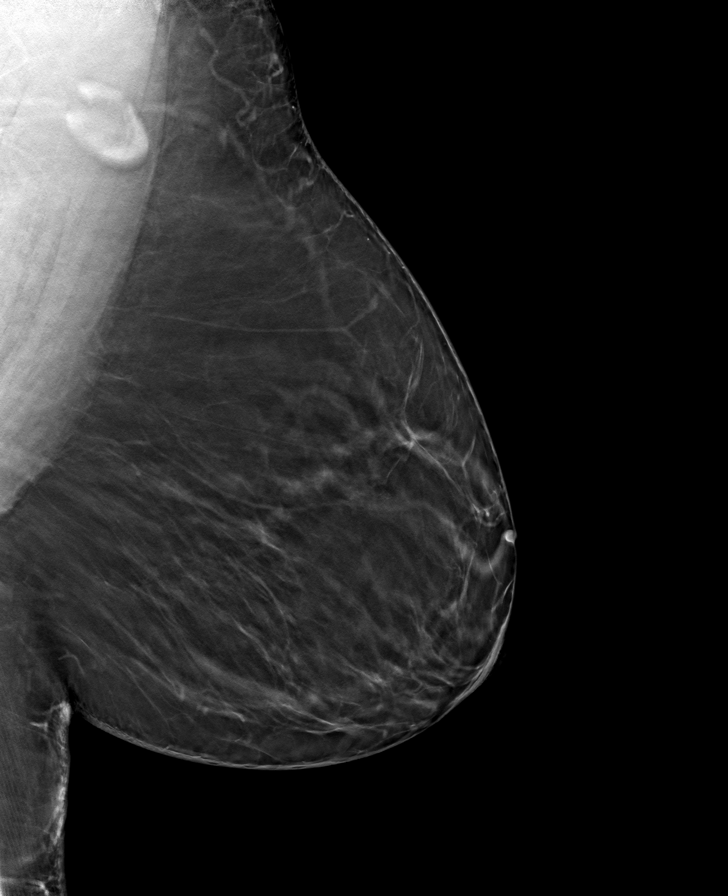

[R CC tomo · tomo slice 35/70.0]
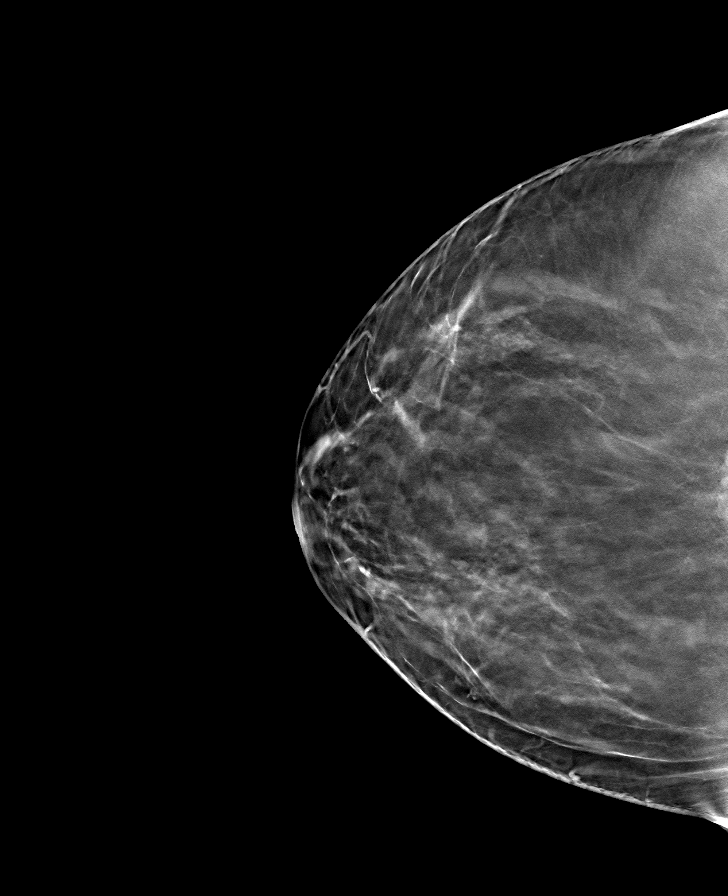

[L CC tomo · tomo slice 33/65.0]
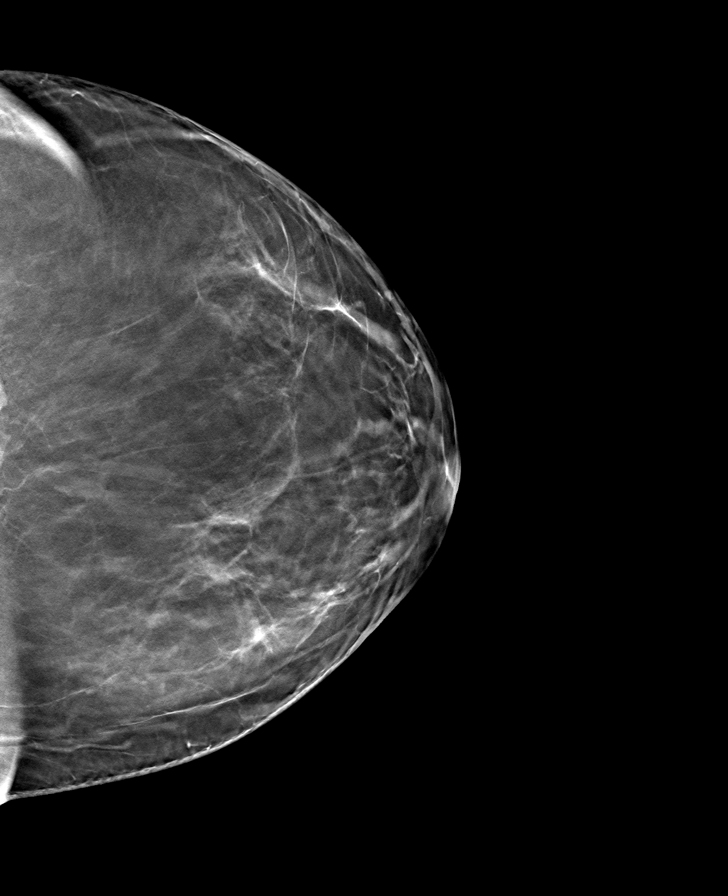

[R MLO tomo · tomo slice 37/73.0]
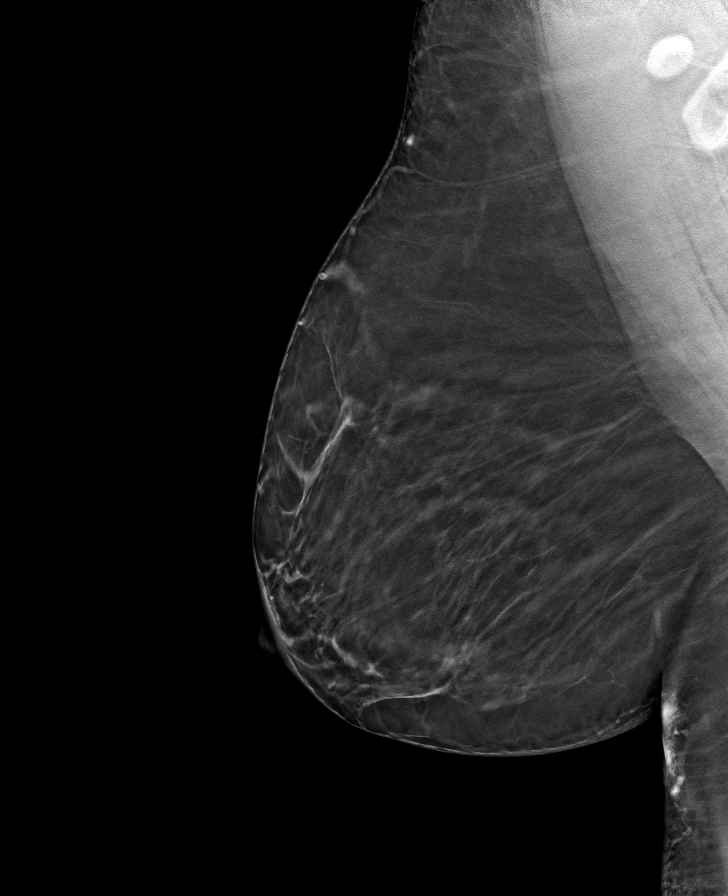

[8 of 24 positions shown; findings below may reference images not displayed]

ACR Breast Density Category b: There are scattered areas of
fibroglandular density.
FINDINGS: There are no findings suspicious for malignancy. Images were
processed with CAD.
IMPRESSION: No mammographic evidence of malignancy. A result letter of this
screening mammogram will be mailed directly to the patient.

RECOMMENDATION:
Screening mammogram in one year. (Code:[id number]-[id number])

BI-RADS CATEGORY  1: Negative.

## 2022-03-30 ENCOUNTER — Inpatient Hospital Stay (HOSPITAL_COMMUNITY)
Admission: EM | Admit: 2022-03-30 | Discharge: 2022-04-03 | DRG: 203 | Disposition: A | Discharge status: Home / Self Care | Payer: Self-pay | Attending: Internal Medicine | Admitting: Internal Medicine

## 2022-03-30 ENCOUNTER — Other Ambulatory Visit: Payer: Self-pay

## 2022-03-30 ENCOUNTER — Emergency Department (HOSPITAL_COMMUNITY): Payer: Self-pay

## 2022-03-30 ENCOUNTER — Encounter (HOSPITAL_COMMUNITY): Payer: Self-pay

## 2022-03-30 DIAGNOSIS — J45901 Unspecified asthma with (acute) exacerbation: Secondary | ICD-10-CM | POA: Diagnosis present

## 2022-03-30 DIAGNOSIS — Z91040 Latex allergy status: Secondary | ICD-10-CM

## 2022-03-30 DIAGNOSIS — B9789 Other viral agents as the cause of diseases classified elsewhere: Secondary | ICD-10-CM | POA: Diagnosis present

## 2022-03-30 DIAGNOSIS — J209 Acute bronchitis, unspecified: Secondary | ICD-10-CM | POA: Diagnosis present

## 2022-03-30 DIAGNOSIS — J4541 Moderate persistent asthma with (acute) exacerbation: Principal | ICD-10-CM | POA: Diagnosis present

## 2022-03-30 DIAGNOSIS — Z20822 Contact with and (suspected) exposure to covid-19: Secondary | ICD-10-CM | POA: Diagnosis present

## 2022-03-30 DIAGNOSIS — J069 Acute upper respiratory infection, unspecified: Secondary | ICD-10-CM | POA: Diagnosis present

## 2022-03-30 DIAGNOSIS — Z79899 Other long term (current) drug therapy: Secondary | ICD-10-CM

## 2022-03-30 DIAGNOSIS — I10 Essential (primary) hypertension: Secondary | ICD-10-CM | POA: Diagnosis present

## 2022-03-30 DIAGNOSIS — F17290 Nicotine dependence, other tobacco product, uncomplicated: Secondary | ICD-10-CM | POA: Diagnosis present

## 2022-03-30 DIAGNOSIS — E876 Hypokalemia: Secondary | ICD-10-CM | POA: Diagnosis present

## 2022-03-30 DIAGNOSIS — Z880 Allergy status to penicillin: Secondary | ICD-10-CM

## 2022-03-30 DIAGNOSIS — Z8249 Family history of ischemic heart disease and other diseases of the circulatory system: Secondary | ICD-10-CM

## 2022-03-30 LAB — CBC
HCT: 42.5 % (ref 36.0–46.0)
Hemoglobin: 14.3 g/dL (ref 12.0–15.0)
MCH: 29.5 pg (ref 26.0–34.0)
MCHC: 33.6 g/dL (ref 30.0–36.0)
MCV: 87.8 fL (ref 80.0–100.0)
Platelets: 322 10*3/uL (ref 150–400)
RBC: 4.84 MIL/uL (ref 3.87–5.11)
RDW: 15.5 % (ref 11.5–15.5)
WBC: 12.6 10*3/uL — ABNORMAL HIGH (ref 4.0–10.5)
nRBC: 0 % (ref 0.0–0.2)

## 2022-03-30 LAB — CBC WITH DIFFERENTIAL/PLATELET
Abs Immature Granulocytes: 0.03 10*3/uL (ref 0.00–0.07)
Basophils Absolute: 0 10*3/uL (ref 0.0–0.1)
Basophils Relative: 0 %
Eosinophils Absolute: 0.1 10*3/uL (ref 0.0–0.5)
Eosinophils Relative: 1 %
HCT: 45.5 % (ref 36.0–46.0)
Hemoglobin: 15.3 g/dL — ABNORMAL HIGH (ref 12.0–15.0)
Immature Granulocytes: 0 %
Lymphocytes Relative: 30 %
Lymphs Abs: 3 10*3/uL (ref 0.7–4.0)
MCH: 29.5 pg (ref 26.0–34.0)
MCHC: 33.6 g/dL (ref 30.0–36.0)
MCV: 87.7 fL (ref 80.0–100.0)
Monocytes Absolute: 0.6 10*3/uL (ref 0.1–1.0)
Monocytes Relative: 6 %
Neutro Abs: 6.2 10*3/uL (ref 1.7–7.7)
Neutrophils Relative %: 63 %
Platelets: 315 10*3/uL (ref 150–400)
RBC: 5.19 MIL/uL — ABNORMAL HIGH (ref 3.87–5.11)
RDW: 15.4 % (ref 11.5–15.5)
WBC: 10 10*3/uL (ref 4.0–10.5)
nRBC: 0 % (ref 0.0–0.2)

## 2022-03-30 LAB — BASIC METABOLIC PANEL
Anion gap: 11 (ref 5–15)
BUN: 8 mg/dL (ref 6–20)
CO2: 23 mmol/L (ref 22–32)
Calcium: 9.7 mg/dL (ref 8.9–10.3)
Chloride: 107 mmol/L (ref 98–111)
Creatinine, Ser: 0.79 mg/dL (ref 0.44–1.00)
GFR, Estimated: >60 mL/min (ref >60)
Glucose, Bld: 119 mg/dL — ABNORMAL HIGH (ref 70–99)
Potassium: 2.9 mmol/L — ABNORMAL LOW (ref 3.5–5.1)
Sodium: 141 mmol/L (ref 135–145)

## 2022-03-30 MED ORDER — METHYLPREDNISOLONE SODIUM SUCC 40 MG IJ SOLR
40.0000 mg | Freq: Two times a day (BID) | INTRAMUSCULAR | Status: DC
  Administered 2022-03-31 – 2022-04-03 (×7): 40 mg via INTRAVENOUS
  Filled 2022-03-30 (×7): qty 1

## 2022-03-30 MED ORDER — BUDESONIDE 0.25 MG/2ML IN SUSP
0.2500 mg | Freq: Two times a day (BID) | RESPIRATORY_TRACT | Status: DC
  Administered 2022-03-30 – 2022-04-03 (×8): 0.25 mg via RESPIRATORY_TRACT
  Filled 2022-03-30 (×8): qty 2

## 2022-03-30 MED ORDER — LEVALBUTEROL HCL 0.63 MG/3ML IN NEBU: 0.6300 mg | INHALATION_SOLUTION | RESPIRATORY_TRACT | Status: DC | PRN

## 2022-03-30 MED ORDER — MORPHINE SULFATE (PF) 4 MG/ML IV SOLN
4.0000 mg | Freq: Once | INTRAVENOUS | Status: AC
  Administered 2022-03-30: 4 mg via INTRAVENOUS
  Filled 2022-03-30: qty 1

## 2022-03-30 MED ORDER — ENOXAPARIN SODIUM 40 MG/0.4ML IJ SOSY
40.0000 mg | PREFILLED_SYRINGE | INTRAMUSCULAR | Status: DC
  Administered 2022-03-31 – 2022-04-03 (×4): 40 mg via SUBCUTANEOUS
  Filled 2022-03-30 (×4): qty 0.4

## 2022-03-30 MED ORDER — HYDRALAZINE HCL 20 MG/ML IJ SOLN: 5.0000 mg | INTRAMUSCULAR | Status: DC | PRN

## 2022-03-30 MED ORDER — ALBUTEROL SULFATE (2.5 MG/3ML) 0.083% IN NEBU
10.0000 mg | INHALATION_SOLUTION | Freq: Once | RESPIRATORY_TRACT | Status: AC
  Administered 2022-03-30: 10 mg via RESPIRATORY_TRACT
  Filled 2022-03-30: qty 12

## 2022-03-30 MED ORDER — ONDANSETRON HCL 4 MG/2ML IJ SOLN
4.0000 mg | Freq: Once | INTRAMUSCULAR | Status: AC
  Administered 2022-03-30: 4 mg via INTRAVENOUS
  Filled 2022-03-30: qty 2

## 2022-03-30 MED ORDER — IPRATROPIUM-ALBUTEROL 0.5-2.5 (3) MG/3ML IN SOLN
3.0000 mL | RESPIRATORY_TRACT | Status: AC
  Administered 2022-03-30 (×3): 3 mL via RESPIRATORY_TRACT
  Filled 2022-03-30: qty 6
  Filled 2022-03-30: qty 3

## 2022-03-30 MED ORDER — POTASSIUM CHLORIDE CRYS ER 20 MEQ PO TBCR
40.0000 meq | EXTENDED_RELEASE_TABLET | Freq: Two times a day (BID) | ORAL | Status: DC
  Administered 2022-03-30: 40 meq via ORAL
  Filled 2022-03-30: qty 2

## 2022-03-30 MED ORDER — PREDNISONE 20 MG PO TABS
60.0000 mg | ORAL_TABLET | Freq: Once | ORAL | Status: AC
  Administered 2022-03-30: 60 mg via ORAL
  Filled 2022-03-30: qty 3

## 2022-03-30 MED ORDER — LEVALBUTEROL HCL 0.63 MG/3ML IN NEBU
0.6300 mg | INHALATION_SOLUTION | RESPIRATORY_TRACT | Status: DC
  Administered 2022-03-30: 0.63 mg via RESPIRATORY_TRACT
  Filled 2022-03-30: qty 3

## 2022-03-30 NOTE — ED Notes (Signed)
IV established but no blood return noted. Phlebotomy attempt x 2 for labs unsuccessful; requested mini lab tech attempt to collect labs.

## 2022-03-30 NOTE — ED Provider Notes (Signed)
[facility]-EMERGENCY DEPT Provider Note   CSN: [id number] Arrival date & time: 03/30/22  1952     History  Chief Complaint  Patient presents with   asthma attack    Denise Ballard is a 51 y.o. female.  51 yo F with a chief complaint of difficulty breathing.  Patient has a history of asthma and feels like this is similar to her prior asthma exacerbations.  She said that she started coughing this morning and then got progressively worse throughout the day.  Tried her nebulizer at home without significant improvement.  No fevers.  She had a family member come over yesterday who ended up having a upper respiratory illness.  Otherwise denies sick contacts.        Home Medications Prior to Admission medications   Medication Sig Start Date End Date Taking? Authorizing Provider  albuterol (VENTOLIN HFA) 108 (90 Base) MCG/ACT inhaler  12/18/19   [provider]  amLODipine (NORVASC) 5 MG tablet Take 5 mg by mouth daily. 01/01/20   [provider]  losartan-hydrochlorothiazide (HYZAAR) 100-25 MG tablet Take 1 tablet by mouth daily. 01/01/20   [provider]      Allergies    Penicillins    Review of Systems   Review of Systems  Physical Exam Updated Vital Signs BP (!) 166/97   Pulse (!) 102   Temp 98.4 F (36.9 C) (Oral)   Resp 16   SpO2 96%  Physical Exam Vitals and nursing note reviewed.  Constitutional:      General: She is not in acute distress.    Appearance: She is well-developed. She is not diaphoretic.  HENT:     Head: Normocephalic and atraumatic.  Eyes:     Pupils: Pupils are equal, round, and reactive to light.  Cardiovascular:     Rate and Rhythm: Normal rate and regular rhythm.     Heart sounds: No murmur heard.    No friction rub. No gallop.  Pulmonary:     Effort: Pulmonary effort is normal.     Breath sounds: Wheezing present. No rales.     Comments: Diminished breath sounds in all fields.  Bronchospastic  cough.  Lungs expiratory effort.  Scattered wheezes. Abdominal:     General: There is no distension.     Palpations: Abdomen is soft.     Tenderness: There is no abdominal tenderness.  Musculoskeletal:        General: No tenderness.     Cervical back: Normal range of motion and neck supple.  Skin:    General: Skin is warm and dry.  Neurological:     Mental Status: She is alert and oriented to person, place, and time.  Psychiatric:        Behavior: Behavior normal.     ED Results / Procedures / Treatments   Labs (all labs ordered are listed, but only abnormal results are displayed) Labs Reviewed  CBC WITH DIFFERENTIAL/PLATELET - Abnormal; Notable for the following components:      Result Value   RBC 5.19 (*)    Hemoglobin 15.3 (*)    All other components within normal limits  BASIC METABOLIC PANEL - Abnormal; Notable for the following components:   Potassium 2.9 (*)    Glucose, Bld 119 (*)    All other components within normal limits    EKG None  Radiology DG Chest Port 1 View  Result Date: 03/30/2022 CLINICAL DATA:  Cough and shortness of breath. EXAM: PORTABLE CHEST  1 VIEW COMPARISON:  None Available. FINDINGS: The heart size is normal. No edema or effusion is present. No focal airspace disease is present. Visualized soft tissues and bony thorax are unremarkable. IMPRESSION: Negative one-view chest x-ray. Electronically Signed   By: Denise Ballard M.D.   On: 03/30/2022 20:46    Procedures Procedures    Medications Ordered in ED Medications  potassium chloride SA (KLOR-CON M) CR tablet 40 mEq (40 mEq Oral Given 03/30/22 2144)  ipratropium-albuterol (DUONEB) 0.5-2.5 (3) MG/3ML nebulizer solution 3 mL (3 mLs Nebulization Given 03/30/22 2027)  predniSONE (DELTASONE) tablet 60 mg (60 mg Oral Given 03/30/22 2018)  morphine (PF) 4 MG/ML injection 4 mg (4 mg Intravenous Given 03/30/22 2117)  ondansetron (ZOFRAN) injection 4 mg (4 mg Intravenous Given 03/30/22 2115)   albuterol (PROVENTIL) (2.5 MG/3ML) 0.083% nebulizer solution 10 mg (10 mg Nebulization Given 03/30/22 2149)    ED Course/ Medical Decision Making/ A&P                           Medical Decision Making Amount and/or Complexity of Data Reviewed Labs: ordered. Radiology: ordered.  Risk Prescription drug management. Decision regarding hospitalization.   51 yo F with a chief complaint of difficulty breathing.  Feels like prior asthma exacerbations.  Was around someone that was sick yesterday.  No fevers.  Started with a cough this morning.  We will give 3 DuoNebs back-to-back steroids reassess.  Patient was some improved aeration on repeat lung exam.  She still feels short of breath and now has have some mild desaturations into the upper 80s.  We will start on a continuous.  Will discuss with medicine for admission.  CRITICAL CARE Performed by: Rae Roam   Total critical care time: 35 minutes  Critical care time was exclusive of separately billable procedures and treating other patients.  Critical care was necessary to treat or prevent imminent or life-threatening deterioration.  Critical care was time spent personally by me on the following activities: development of treatment plan with patient and/or surrogate as well as nursing, discussions with consultants, evaluation of patient's response to treatment, examination of patient, obtaining history from patient or surrogate, ordering and performing treatments and interventions, ordering and review of laboratory studies, ordering and review of radiographic studies, pulse oximetry and re-evaluation of patient's condition.  The patients results and plan were reviewed and discussed.   Any x-rays performed were independently reviewed by myself.   Differential diagnosis were considered with the presenting HPI.  Medications  potassium chloride SA (KLOR-CON M) CR tablet 40 mEq (40 mEq Oral Given 03/30/22 2144)   ipratropium-albuterol (DUONEB) 0.5-2.5 (3) MG/3ML nebulizer solution 3 mL (3 mLs Nebulization Given 03/30/22 2027)  predniSONE (DELTASONE) tablet 60 mg (60 mg Oral Given 03/30/22 2018)  morphine (PF) 4 MG/ML injection 4 mg (4 mg Intravenous Given 03/30/22 2117)  ondansetron (ZOFRAN) injection 4 mg (4 mg Intravenous Given 03/30/22 2115)  albuterol (PROVENTIL) (2.5 MG/3ML) 0.083% nebulizer solution 10 mg (10 mg Nebulization Given 03/30/22 2149)    Vitals:   03/30/22 2024 03/30/22 2050 03/30/22 2150 03/30/22 2215  BP: (!) 155/88 (!) 166/97    Pulse: (!) 119 (!) 123  (!) 102  Resp: 20 18  16   Temp:      TempSrc:      SpO2: 93% 97% 90% 96%    Final diagnoses:  Moderate persistent asthma with exacerbation    Admission/ observation were discussed with the admitting  physician, patient and/or family and they are comfortable with the plan.          Final Clinical Impression(s) / ED Diagnoses Final diagnoses:  Moderate persistent asthma with exacerbation    Rx / DC Orders ED Discharge Orders     None         Melene Plan, DO 03/30/22 2243

## 2022-03-30 NOTE — ED Triage Notes (Signed)
SHOB x 1 hour. Neb treatment and inhaler at home with no relief. 2 nebs EMS

## 2022-03-30 NOTE — ED Notes (Signed)
Dark green sent to lab on ice

## 2022-03-30 NOTE — H&P (Signed)
History and Physical    Bob Eastwood WUJ:[id number] DOB: 11-30-1970 DOA: 03/30/2022  PCP: Felix Pacini, FNP  Patient coming from: Home.  Chief Complaint: Shortness of breath.  HPI: Denise Ballard is a 51 y.o. female with history of asthma, ongoing tobacco abuse, hypertension presents to the ER with complaints of shortness of breath since this morning.  Patient states that one of her relatives was sick for the last 2 days with upper respiratory tract infection.  Patient has been having diffuse wheezing with chest tightness.  Denies any productive cough fever or chills.  ED Course: In the ER patient is found to be diffusely wheezing was given steroids and nebulizer despite which patient was still short of breath with cough admitted for further management.  Labs show hypokalemia.  Review of Systems: As per HPI, rest all negative.   Past Medical History:  Diagnosis Date   Hypertension     Past Surgical History:  Procedure Laterality Date   CESAREAN SECTION       reports that she has been smoking cigars. She has been smoking an average of .25 packs per day. She has never used smokeless tobacco. She reports that she does not use drugs. No history on file for alcohol use.  Allergies  Allergen Reactions   Penicillins     Family History  Problem Relation Age of Onset   Cancer Mother 57       GYN cancer (?cervical), bladder cancer later   Breast cancer Maternal Aunt 60   Cancer Maternal Uncle        prostate    Diabetes Paternal Grandmother    Heart failure Paternal Grandmother     Prior to Admission medications   Medication Sig Start Date End Date Taking? Authorizing Provider  albuterol (VENTOLIN HFA) 108 (90 Base) MCG/ACT inhaler  12/18/19   [provider]  amLODipine (NORVASC) 5 MG tablet Take 5 mg by mouth daily. 01/01/20   [provider]  losartan-hydrochlorothiazide (HYZAAR) 100-25 MG tablet Take 1 tablet by mouth daily. 01/01/20   [provider]    Physical Exam: Constitutional: Moderately built and nourished. Vitals:   03/30/22 2024 03/30/22 2050 03/30/22 2150 03/30/22 2215  BP: (!) 155/88 (!) 166/97    Pulse: (!) 119 (!) 123  (!) 102  Resp: 20 18  16   Temp:      TempSrc:      SpO2: 93% 97% 90% 96%   Eyes: Anicteric no pallor. ENMT: No discharge from the ears eyes nose and mouth. Neck: No mass felt.  No neck rigidity. Respiratory: Bilateral expiratory wheeze and no crepitations. Cardiovascular: S1-S2 heard. Abdomen: Soft nontender bowel sound present. Musculoskeletal: No edema. Skin: No rash. Neurologic: Alert awake oriented to time place and person.  Moves all extremities. Psychiatric: Appears normal.  Normal affect.   Labs on Admission: I have personally reviewed following labs and imaging studies  CBC: Recent Labs  Lab 03/30/22 2009  WBC 10.0  NEUTROABS 6.2  HGB 15.3*  HCT 45.5  MCV 87.7  PLT 315   Basic Metabolic Panel: Recent Labs  Lab 03/30/22 2009  NA 141  K 2.9*  CL 107  CO2 23  GLUCOSE 119*  BUN 8  CREATININE 0.79  CALCIUM 9.7   GFR: CrCl cannot be calculated (Unknown ideal weight.). Liver Function Tests: No results for input(s): "AST", "ALT", "ALKPHOS", "BILITOT", "PROT", "ALBUMIN" in the last 168 hours. No results for input(s): "LIPASE", "AMYLASE" in the last 168 hours. No results  for input(s): "AMMONIA" in the last 168 hours. Coagulation Profile: No results for input(s): "INR", "PROTIME" in the last 168 hours. Cardiac Enzymes: No results for input(s): "CKTOTAL", "CKMB", "CKMBINDEX", "TROPONINI" in the last 168 hours. BNP (last 3 results) No results for input(s): "PROBNP" in the last 8760 hours. HbA1C: No results for input(s): "HGBA1C" in the last 72 hours. CBG: No results for input(s): "GLUCAP" in the last 168 hours. Lipid Profile: No results for input(s): "CHOL", "HDL", "LDLCALC", "TRIG", "CHOLHDL", "LDLDIRECT" in the last 72 hours. Thyroid Function Tests: No  results for input(s): "TSH", "T4TOTAL", "FREET4", "T3FREE", "THYROIDAB" in the last 72 hours. Anemia Panel: No results for input(s): "VITAMINB12", "FOLATE", "FERRITIN", "TIBC", "IRON", "RETICCTPCT" in the last 72 hours. Urine analysis:    Component Value Date/Time   COLORURINE YELLOW 04/28/2019 2115   APPEARANCEUR CLEAR 04/28/2019 2115   LABSPEC 1.009 04/28/2019 2115   PHURINE 6.0 04/28/2019 2115   GLUCOSEU NEGATIVE 04/28/2019 2115   HGBUR NEGATIVE 04/28/2019 2115   BILIRUBINUR NEGATIVE 04/28/2019 2115   KETONESUR 5 (A) 04/28/2019 2115   PROTEINUR NEGATIVE 04/28/2019 2115   NITRITE NEGATIVE 04/28/2019 2115   LEUKOCYTESUR NEGATIVE 04/28/2019 2115   Sepsis Labs: @LABRCNTIP (procalcitonin:4,lacticidven:4) )No results found for this or any previous visit (from the past 240 hour(s)).   Radiological Exams on Admission: DG Chest Port 1 View  Result Date: 03/30/2022 CLINICAL DATA:  Cough and shortness of breath. EXAM: PORTABLE CHEST 1 VIEW COMPARISON:  None Available. FINDINGS: The heart size is normal. No edema or effusion is present. No focal airspace disease is present. Visualized soft tissues and bony thorax are unremarkable. IMPRESSION: Negative one-view chest x-ray. Electronically Signed   By: 04/01/2022 M.D.   On: 03/30/2022 20:46     Assessment/Plan Principal Problem:   Asthma exacerbation Active Problems:   Essential hypertension    Acute asthma exacerbation/acute bronchitis -since patient's family member had symptoms of upper respiratory tract infection will check viral panel and also COVID test.  We will continue patient on nebulizers Pulmicort and IV steroids.  Since patient has some chest pressure we will check troponin and EKG. Hypertension we will continue home dose of beta-blockers and ARB after confirming the dose.  As needed IV hydralazine for systolic blood pressure more than 160. Hypokalemia replace and recheck.  Check magnesium with next labs.   DVT  prophylaxis: Lovenox. Code Status: Full code. Family Communication: Discussed with patient. Disposition Plan: Home. Consults called: None. Admission status: Observation.   04/01/2022 MD [facility] Pager [phone].  If 7PM-7AM, please contact night-coverage [email or web address] Password [facility]  03/30/2022, 10:48 PM

## 2022-03-31 DIAGNOSIS — J45901 Unspecified asthma with (acute) exacerbation: Secondary | ICD-10-CM

## 2022-03-31 LAB — BASIC METABOLIC PANEL
Anion gap: 11 (ref 5–15)
BUN: 8 mg/dL (ref 6–20)
CO2: 21 mmol/L — ABNORMAL LOW (ref 22–32)
Calcium: 9.9 mg/dL (ref 8.9–10.3)
Chloride: 106 mmol/L (ref 98–111)
Creatinine, Ser: 0.8 mg/dL (ref 0.44–1.00)
GFR, Estimated: >60 mL/min (ref >60)
Glucose, Bld: 164 mg/dL — ABNORMAL HIGH (ref 70–99)
Potassium: 4 mmol/L (ref 3.5–5.1)
Sodium: 138 mmol/L (ref 135–145)

## 2022-03-31 LAB — RESPIRATORY PANEL BY PCR

## 2022-03-31 LAB — TROPONIN I (HIGH SENSITIVITY): Troponin I (High Sensitivity): 3 ng/L (ref <18)

## 2022-03-31 LAB — MAGNESIUM: Magnesium: 1.7 mg/dL (ref 1.7–2.4)

## 2022-03-31 LAB — CREATININE, SERUM
Creatinine, Ser: 0.66 mg/dL (ref 0.44–1.00)
GFR, Estimated: >60 mL/min (ref >60)

## 2022-03-31 LAB — SARS CORONAVIRUS 2 BY RT PCR: SARS Coronavirus 2 by RT PCR: NEGATIVE

## 2022-03-31 LAB — HIV ANTIBODY (ROUTINE TESTING W REFLEX): HIV Screen 4th Generation wRfx: NONREACTIVE

## 2022-03-31 MED ORDER — LOSARTAN POTASSIUM 50 MG PO TABS
100.0000 mg | ORAL_TABLET | Freq: Every morning | ORAL | Status: DC
  Administered 2022-04-01 – 2022-04-03 (×3): 100 mg via ORAL
  Filled 2022-03-31 (×3): qty 2

## 2022-03-31 MED ORDER — TRAMADOL HCL 50 MG PO TABS
25.0000 mg | ORAL_TABLET | Freq: Once | ORAL | Status: AC
  Administered 2022-03-31: 25 mg via ORAL
  Filled 2022-03-31: qty 1

## 2022-03-31 MED ORDER — METHYLPREDNISOLONE SODIUM SUCC 40 MG IJ SOLR
40.0000 mg | Freq: Once | INTRAMUSCULAR | Status: AC
  Administered 2022-03-31: 40 mg via INTRAVENOUS
  Filled 2022-03-31: qty 1

## 2022-03-31 MED ORDER — ARFORMOTEROL TARTRATE 15 MCG/2ML IN NEBU
15.0000 ug | INHALATION_SOLUTION | Freq: Two times a day (BID) | RESPIRATORY_TRACT | Status: DC
  Administered 2022-03-31 – 2022-04-03 (×7): 15 ug via RESPIRATORY_TRACT
  Filled 2022-03-31 (×8): qty 2

## 2022-03-31 MED ORDER — GUAIFENESIN-DM 100-10 MG/5ML PO SYRP
5.0000 mL | ORAL_SOLUTION | ORAL | Status: DC | PRN
Start: 2022-03-31 — End: 2022-04-03
  Administered 2022-03-31 – 2022-04-03 (×8): 5 mL via ORAL
  Filled 2022-03-31 (×8): qty 5

## 2022-03-31 MED ORDER — MAGNESIUM SULFATE 2 GM/50ML IV SOLN
2.0000 g | Freq: Once | INTRAVENOUS | Status: AC
  Administered 2022-03-31: 2 g via INTRAVENOUS
  Filled 2022-03-31: qty 50

## 2022-03-31 MED ORDER — ALBUTEROL SULFATE (2.5 MG/3ML) 0.083% IN NEBU: 2.5000 mg | INHALATION_SOLUTION | RESPIRATORY_TRACT | Status: DC | PRN

## 2022-03-31 MED ORDER — HYDROCODONE-ACETAMINOPHEN 5-325 MG PO TABS
1.0000 | ORAL_TABLET | Freq: Four times a day (QID) | ORAL | Status: DC | PRN
  Administered 2022-03-31 – 2022-04-03 (×9): 1 via ORAL
  Filled 2022-03-31 (×9): qty 1

## 2022-03-31 MED ORDER — IPRATROPIUM-ALBUTEROL 0.5-2.5 (3) MG/3ML IN SOLN
3.0000 mL | RESPIRATORY_TRACT | Status: DC
  Administered 2022-03-31 – 2022-04-03 (×19): 3 mL via RESPIRATORY_TRACT
  Filled 2022-03-31 (×20): qty 3

## 2022-03-31 NOTE — Progress Notes (Signed)
PROGRESS NOTE    Denise Ballard  NWG:[id number] DOB: 1971-07-21 DOA: 03/30/2022 PCP: Felix Pacini, FNP   Brief Narrative: 51 year old with past medical history significant for asthma, ongoing tobacco abuse, hypertension presents complaining of worsening shortness of breath that is started the morning of admission.  One of the relative was sick for the last 2 days with upper respiratory tract infection.  She reports chest tightness and wheezing.  Patient admitted for asthma exacerbation   Assessment & Plan:   Principal Problem:   Asthma exacerbation Active Problems:   Essential hypertension  1-Acute asthma exacerbation/Acute Bronchitis.  In the setting of upper respiratory infection, rhinovirus, current  tobacco use She is requiring 2 L oxygen. To keep sat in the 90/  Continue with IV steroids We will give IV magnesium Continue with Pulmicort and Brovana. Continue with albuterol and ipratropium every 4 hours Robitussin as needed  2-HTN; resume losartan Hold beta-blocker in the setting of asthma exacerbation  3-Hypokalemia; Replaced.   Hypomagnesemia; replete IV>       Estimated body mass index is 37.81 kg/m as calculated from the following:   Height as of 01/19/20: 5' 7.5" (1.715 m).   Weight as of 01/19/20: 111.1 kg.   DVT prophylaxis: Lovenox Code Status: Full code Family Communication: care discussed with patient.  Disposition Plan:  Status is: Observation The patient will require care spanning > 2 midnights and should be moved to inpatient because: management asthma exacerbation.     Consultants:  None  Procedures:  None  Antimicrobials:    Subjective: She report SOB, chest tightness. She feels very sick. She is determine to stop smoking.    Objective: Vitals:   03/31/22 0340 03/31/22 0345 03/31/22 0500 03/31/22 0630  BP:  (!) 149/96 (!) 157/102 (!) 143/92  Pulse:  (!) 102 (!) 102 91  Resp:  20 (!) 28 (!) 26  Temp: 98 F (36.7 C)      TempSrc: Oral     SpO2:  90% 91% 91%   No intake or output data in the 24 hours ending 03/31/22 0712 There were no vitals filed for this visit.  Examination:  General exam: Appears calm and comfortable  Respiratory system: Tachypnea, BL wheezing.  Cardiovascular system: S1 & S2 heard, RRR.  Gastrointestinal system: Abdomen is nondistended, soft and nontender. No organomegaly or masses felt. Normal bowel sounds heard. Central nervous system: Alert and oriented. No focal neurological deficits. Extremities: Symmetric 5 x 5 power.    Data Reviewed: I have personally reviewed following labs and imaging studies  CBC: Recent Labs  Lab 03/30/22 2009 03/30/22 2339  WBC 10.0 12.6*  NEUTROABS 6.2  --   HGB 15.3* 14.3  HCT 45.5 42.5  MCV 87.7 87.8  PLT 315 322   Basic Metabolic Panel: Recent Labs  Lab 03/30/22 2009 03/30/22 2339 03/31/22 0305 03/31/22 0440  NA 141  --   --  138  K 2.9*  --   --  4.0  CL 107  --   --  106  CO2 23  --   --  21*  GLUCOSE 119*  --   --  164*  BUN 8  --   --  8  CREATININE 0.79 0.66  --  0.80  CALCIUM 9.7  --   --  9.9  MG  --   --  1.7  --    GFR: CrCl cannot be calculated (Unknown ideal weight.). Liver Function Tests: No results for input(s): "AST", "ALT", "ALKPHOS", "  BILITOT", "PROT", "ALBUMIN" in the last 168 hours. No results for input(s): "LIPASE", "AMYLASE" in the last 168 hours. No results for input(s): "AMMONIA" in the last 168 hours. Coagulation Profile: No results for input(s): "INR", "PROTIME" in the last 168 hours. Cardiac Enzymes: No results for input(s): "CKTOTAL", "CKMB", "CKMBINDEX", "TROPONINI" in the last 168 hours. BNP (last 3 results) No results for input(s): "PROBNP" in the last 8760 hours. HbA1C: No results for input(s): "HGBA1C" in the last 72 hours. CBG: No results for input(s): "GLUCAP" in the last 168 hours. Lipid Profile: No results for input(s): "CHOL", "HDL", "LDLCALC", "TRIG", "CHOLHDL", "LDLDIRECT" in  the last 72 hours. Thyroid Function Tests: No results for input(s): "TSH", "T4TOTAL", "FREET4", "T3FREE", "THYROIDAB" in the last 72 hours. Anemia Panel: No results for input(s): "VITAMINB12", "FOLATE", "FERRITIN", "TIBC", "IRON", "RETICCTPCT" in the last 72 hours. Sepsis Labs: No results for input(s): "PROCALCITON", "LATICACIDVEN" in the last 168 hours.  Recent Results (from the past 240 hour(s))  Respiratory (~20 pathogens) panel by PCR     Status: Abnormal   Collection Time: 03/31/22  2:20 AM   Specimen: Anterior Nasal Swab; Respiratory  Result Value Ref Range Status   Adenovirus NOT DETECTED NOT DETECTED Final   Coronavirus 229E NOT DETECTED NOT DETECTED Final    Comment: (NOTE) The Coronavirus on the Respiratory Panel, DOES NOT test for the novel  Coronavirus (2019 nCoV)    Coronavirus HKU1 NOT DETECTED NOT DETECTED Final   Coronavirus NL63 NOT DETECTED NOT DETECTED Final   Coronavirus OC43 NOT DETECTED NOT DETECTED Final   Metapneumovirus NOT DETECTED NOT DETECTED Final   Rhinovirus / Enterovirus DETECTED (A) NOT DETECTED Final   Influenza A NOT DETECTED NOT DETECTED Final   Influenza B NOT DETECTED NOT DETECTED Final   Parainfluenza Virus 1 NOT DETECTED NOT DETECTED Final   Parainfluenza Virus 2 NOT DETECTED NOT DETECTED Final   Parainfluenza Virus 3 NOT DETECTED NOT DETECTED Final   Parainfluenza Virus 4 NOT DETECTED NOT DETECTED Final   Respiratory Syncytial Virus NOT DETECTED NOT DETECTED Final   Bordetella pertussis NOT DETECTED NOT DETECTED Final   Bordetella Parapertussis NOT DETECTED NOT DETECTED Final   Chlamydophila pneumoniae NOT DETECTED NOT DETECTED Final   Mycoplasma pneumoniae NOT DETECTED NOT DETECTED Final    Comment: Performed at [facility], [address], [place], [place] [address]  SARS Coronavirus 2 by RT PCR (hospital order, performed in [facility] hospital lab) *cepheid single result test* Anterior Nasal Swab     Status: None   Collection  Time: 03/31/22  2:20 AM   Specimen: Anterior Nasal Swab  Result Value Ref Range Status   SARS Coronavirus 2 by RT PCR NEGATIVE NEGATIVE Final    Comment: (NOTE) SARS-CoV-2 target nucleic acids are NOT DETECTED.  The SARS-CoV-2 RNA is generally detectable in upper and lower respiratory specimens during the acute phase of infection. The lowest concentration of SARS-CoV-2 viral copies this assay can detect is 250 copies / mL. A negative result does not preclude SARS-CoV-2 infection and should not be used as the sole basis for treatment or other patient management decisions.  A negative result may occur with improper specimen collection / handling, submission of specimen other than nasopharyngeal swab, presence of viral mutation(s) within the areas targeted by this assay, and inadequate number of viral copies (<250 copies / mL). A negative result must be combined with clinical observations, patient history, and epidemiological information.  Fact Sheet for Patients:   RoadLapTop.co.za  Fact  Sheet for Healthcare Providers: http://kim-miller.com/  This test is not yet approved or  cleared by the [place] and has been authorized for detection and/or diagnosis of SARS-CoV-2 by FDA under an Emergency Use Authorization (EUA).  This EUA will remain in effect (meaning this test can be used) for the duration of the COVID-19 declaration under Section 564(b)(1) of the Act, 21 U.S.C. section 360bbb-3(b)(1), unless the authorization is terminated or revoked sooner.  Performed at [facility], [address] [place], [place] [address]          Radiology Studies: [facility] Chest Port 1 View  Result Date: 03/30/2022 CLINICAL DATA:  Cough and shortness of breath. EXAM: PORTABLE CHEST 1 VIEW COMPARISON:  None Available. FINDINGS: The heart size is normal. No edema or effusion is present. No focal airspace disease is present.  Visualized soft tissues and bony thorax are unremarkable. IMPRESSION: Negative one-view chest x-ray. Electronically Signed   By: Marin Roberts M.D.   On: 03/30/2022 20:46        Scheduled Meds:  budesonide (PULMICORT) nebulizer solution  0.25 mg Nebulization BID   enoxaparin (LOVENOX) injection  40 mg Subcutaneous Q24H   ipratropium-albuterol  3 mL Nebulization Q4H   methylPREDNISolone (SOLU-MEDROL) injection  40 mg Intravenous Q12H   Continuous Infusions:   LOS: 0 days    Time spent: 35 minutes    Rajinder Mesick A Izzy Courville, MD [facility]   If 7PM-7AM, please contact night-coverage [email or web address]  03/31/2022, 7:12 AM

## 2022-04-01 MED ORDER — MAGNESIUM SULFATE 2 GM/50ML IV SOLN
2.0000 g | Freq: Once | INTRAVENOUS | Status: AC
Start: 2022-04-01 — End: 2022-04-01
  Administered 2022-04-01: 2 g via INTRAVENOUS
  Filled 2022-04-01: qty 50

## 2022-04-01 NOTE — Progress Notes (Signed)
PROGRESS NOTE    Denise Ballard  FUX:[id number] DOB: Jul 07, 1971 DOA: 03/30/2022 PCP: Felix Pacini, FNP   Brief Narrative: 51 year old with past medical history significant for asthma, ongoing tobacco abuse, hypertension presents complaining of worsening shortness of breath that is started the morning of admission.  One of the relative was sick for the last 2 days with upper respiratory tract infection.  She reports chest tightness and wheezing.  Patient admitted for asthma exacerbation.   Assessment & Plan:   Principal Problem:   Asthma exacerbation Active Problems:   Essential hypertension  1-Acute asthma exacerbation/Acute Bronchitis.  In the setting of upper respiratory infection, rhinovirus, current  tobacco use She is requiring 2 L oxygen. To keep sat in the 90/  Continue with IV steroids Repeat  IV magnesium Continue with Pulmicort and Brovana. Continue with albuterol and ipratropium every 4 hours Robitussin as needed Still with BL wheezing, continue with current management.   2-HTN; Continue with losartan Hold beta-blocker in the setting of asthma exacerbation  3-Hypokalemia; Replaced.   Hypomagnesemia; Replaced.       Estimated body mass index is 37.81 kg/m as calculated from the following:   Height as of 01/19/20: 5' 7.5" (1.715 m).   Weight as of 01/19/20: 111.1 kg.   DVT prophylaxis: Lovenox Code Status: Full code Family Communication: care discussed with patient.  Disposition Plan:  Status is: Observation The patient will require care spanning > 2 midnights and should be moved to inpatient because: management asthma exacerbation.     Consultants:  None  Procedures:  None  Antimicrobials:    Subjective: She is breathing better,. Not at baseline yet.  Cough persist.    Objective: Vitals:   03/31/22 2316 04/01/22 0536 04/01/22 0757 04/01/22 1228  BP:  136/77  (!) 155/89  Pulse:  72  94  Resp:    20  Temp:  98.4 F (36.9 C)  98  F (36.7 C)  TempSrc:  Oral  Oral  SpO2: 100% 96% 97% 93%    Intake/Output Summary (Last 24 hours) at 04/01/2022 1641 Last data filed at 04/01/2022 1300 Gross per 24 hour  Intake 530 ml  Output --  Net 530 ml   There were no vitals filed for this visit.  Examination:  General exam: NAD Respiratory system: BL expiratory wheezing.  Cardiovascular system: S1 & S2 heard, RRR.  Gastrointestinal system: BS present , soft, nt Central nervous system: alert Extremities:  no edema    Data Reviewed: I have personally reviewed following labs and imaging studies  CBC: Recent Labs  Lab 03/30/22 2009 03/30/22 2339  WBC 10.0 12.6*  NEUTROABS 6.2  --   HGB 15.3* 14.3  HCT 45.5 42.5  MCV 87.7 87.8  PLT 315 322    Basic Metabolic Panel: Recent Labs  Lab 03/30/22 2009 03/30/22 2339 03/31/22 0305 03/31/22 0440  NA 141  --   --  138  K 2.9*  --   --  4.0  CL 107  --   --  106  CO2 23  --   --  21*  GLUCOSE 119*  --   --  164*  BUN 8  --   --  8  CREATININE 0.79 0.66  --  0.80  CALCIUM 9.7  --   --  9.9  MG  --   --  1.7  --     GFR: CrCl cannot be calculated (Unknown ideal weight.). Liver Function Tests: No results for input(s): "AST", "ALT", "  ALKPHOS", "BILITOT", "PROT", "ALBUMIN" in the last 168 hours. No results for input(s): "LIPASE", "AMYLASE" in the last 168 hours. No results for input(s): "AMMONIA" in the last 168 hours. Coagulation Profile: No results for input(s): "INR", "PROTIME" in the last 168 hours. Cardiac Enzymes: No results for input(s): "CKTOTAL", "CKMB", "CKMBINDEX", "TROPONINI" in the last 168 hours. BNP (last 3 results) No results for input(s): "PROBNP" in the last 8760 hours. HbA1C: No results for input(s): "HGBA1C" in the last 72 hours. CBG: No results for input(s): "GLUCAP" in the last 168 hours. Lipid Profile: No results for input(s): "CHOL", "HDL", "LDLCALC", "TRIG", "CHOLHDL", "LDLDIRECT" in the last 72 hours. Thyroid Function Tests: No  results for input(s): "TSH", "T4TOTAL", "FREET4", "T3FREE", "THYROIDAB" in the last 72 hours. Anemia Panel: No results for input(s): "VITAMINB12", "FOLATE", "FERRITIN", "TIBC", "IRON", "RETICCTPCT" in the last 72 hours. Sepsis Labs: No results for input(s): "PROCALCITON", "LATICACIDVEN" in the last 168 hours.  Recent Results (from the past 240 hour(s))  Respiratory (~20 pathogens) panel by PCR     Status: Abnormal   Collection Time: 03/31/22  2:20 AM   Specimen: Anterior Nasal Swab; Respiratory  Result Value Ref Range Status   Adenovirus NOT DETECTED NOT DETECTED Final   Coronavirus 229E NOT DETECTED NOT DETECTED Final    Comment: (NOTE) The Coronavirus on the Respiratory Panel, DOES NOT test for the novel  Coronavirus (2019 nCoV)    Coronavirus HKU1 NOT DETECTED NOT DETECTED Final   Coronavirus NL63 NOT DETECTED NOT DETECTED Final   Coronavirus OC43 NOT DETECTED NOT DETECTED Final   Metapneumovirus NOT DETECTED NOT DETECTED Final   Rhinovirus / Enterovirus DETECTED (A) NOT DETECTED Final   Influenza A NOT DETECTED NOT DETECTED Final   Influenza B NOT DETECTED NOT DETECTED Final   Parainfluenza Virus 1 NOT DETECTED NOT DETECTED Final   Parainfluenza Virus 2 NOT DETECTED NOT DETECTED Final   Parainfluenza Virus 3 NOT DETECTED NOT DETECTED Final   Parainfluenza Virus 4 NOT DETECTED NOT DETECTED Final   Respiratory Syncytial Virus NOT DETECTED NOT DETECTED Final   Bordetella pertussis NOT DETECTED NOT DETECTED Final   Bordetella Parapertussis NOT DETECTED NOT DETECTED Final   Chlamydophila pneumoniae NOT DETECTED NOT DETECTED Final   Mycoplasma pneumoniae NOT DETECTED NOT DETECTED Final    Comment: Performed at [facility], [address] [place], [place] [address]  SARS Coronavirus 2 by RT PCR (hospital order, performed in [facility] hospital lab) *cepheid single result test* Anterior Nasal Swab     Status: None   Collection Time: 03/31/22  2:20 AM   Specimen: Anterior  Nasal Swab  Result Value Ref Range Status   SARS Coronavirus 2 by RT PCR NEGATIVE NEGATIVE Final    Comment: (NOTE) SARS-CoV-2 target nucleic acids are NOT DETECTED.  The SARS-CoV-2 RNA is generally detectable in upper and lower respiratory specimens during the acute phase of infection. The lowest concentration of SARS-CoV-2 viral copies this assay can detect is 250 copies / mL. A negative result does not preclude SARS-CoV-2 infection and should not be used as the sole basis for treatment or other patient management decisions.  A negative result may occur with improper specimen collection / handling, submission of specimen other than nasopharyngeal swab, presence of viral mutation(s) within the areas targeted by this assay, and inadequate number of viral copies (<250 copies / mL). A negative result must be combined with clinical observations, patient history, and epidemiological information.  Fact Sheet for Patients:   RoadLapTop.co.za  Fact Sheet for Healthcare Providers: https://hall.com/  This test is not yet approved or  cleared by the [place] FDA and has been authorized for detection and/or diagnosis of SARS-CoV-2 by FDA under an Emergency Use Authorization (EUA).  This EUA will remain in effect (meaning this test can be used) for the duration of the COVID-19 declaration under Section 564(b)(1) of the Act, 21 U.S.C. section 360bbb-3(b)(1), unless the authorization is terminated or revoked sooner.  Performed at [facility], [place] [address], [place], [place] [address]          Radiology Studies: [facility] Chest Port 1 View  Result Date: 03/30/2022 CLINICAL DATA:  Cough and shortness of breath. EXAM: PORTABLE CHEST 1 VIEW COMPARISON:  None Available. FINDINGS: The heart size is normal. No edema or effusion is present. No focal airspace disease is present. Visualized soft tissues and bony thorax are  unremarkable. IMPRESSION: Negative one-view chest x-ray. Electronically Signed   By: San Morelle M.D.   On: 03/30/2022 20:46        Scheduled Meds:  arformoterol  15 mcg Nebulization BID   budesonide (PULMICORT) nebulizer solution  0.25 mg Nebulization BID   enoxaparin (LOVENOX) injection  40 mg Subcutaneous Q24H   ipratropium-albuterol  3 mL Nebulization Q4H   losartan  100 mg Oral q morning   methylPREDNISolone (SOLU-MEDROL) injection  40 mg Intravenous Q12H   Continuous Infusions:   LOS: 1 day    Time spent: 35 minutes    Layah Skousen A Lilianne Delair, MD [facility]   If 7PM-7AM, please contact night-coverage [email or web address]  04/01/2022, 4:41 PM

## 2022-04-02 LAB — BASIC METABOLIC PANEL
Anion gap: 10 (ref 5–15)
BUN: 20 mg/dL (ref 6–20)
CO2: 20 mmol/L — ABNORMAL LOW (ref 22–32)
Calcium: 9.8 mg/dL (ref 8.9–10.3)
Chloride: 106 mmol/L (ref 98–111)
Creatinine, Ser: 0.7 mg/dL (ref 0.44–1.00)
GFR, Estimated: >60 mL/min (ref >60)
Glucose, Bld: 116 mg/dL — ABNORMAL HIGH (ref 70–99)
Potassium: 3.8 mmol/L (ref 3.5–5.1)
Sodium: 136 mmol/L (ref 135–145)

## 2022-04-02 MED ORDER — PANTOPRAZOLE SODIUM 40 MG PO TBEC
40.0000 mg | DELAYED_RELEASE_TABLET | Freq: Two times a day (BID) | ORAL | Status: DC
  Administered 2022-04-02 – 2022-04-03 (×2): 40 mg via ORAL
  Filled 2022-04-02 (×2): qty 1

## 2022-04-02 NOTE — Progress Notes (Addendum)
PROGRESS NOTE    Denise Ballard  IHK:[id number] DOB: February 18, 1971 DOA: 03/30/2022 PCP: Felix Pacini, FNP   Brief Narrative: 51 year old with past medical history significant for asthma, ongoing tobacco abuse, hypertension presents complaining of worsening shortness of breath that is started the morning of admission.  One of the relative was sick for the last 2 days with upper respiratory tract infection.  She reports chest tightness and wheezing.  Patient admitted for asthma exacerbation.   Assessment & Plan:   Principal Problem:   Asthma exacerbation Active Problems:   Essential hypertension  1-Acute asthma exacerbation/Acute Bronchitis.  In the setting of upper respiratory infection, rhinovirus, current  tobacco use She is requiring 2 L oxygen. To keep sat in the 90/  Continue with IV steroids Received  IV magnesium Continue with Pulmicort and Brovana. Continue with albuterol and ipratropium every 4 hours Robitussin as needed She had an episode of worsening SOB this am, when she wake up. She skip nebulizer last night.  She is feeling better from earlier episode.  Plan to continue with IV steroids.  Start PPI  2-HTN; Continue with losartan Hold beta-blocker in the setting of asthma exacerbation  3-Hypokalemia; Replaced.   Hypomagnesemia; Replaced.       Estimated body mass index is 37.81 kg/m as calculated from the following:   Height as of 01/19/20: 5' 7.5" (1.715 m).   Weight as of 01/19/20: 111.1 kg.   DVT prophylaxis: Lovenox Code Status: Full code Family Communication: care discussed with patient.  Disposition Plan:  Status is: Observation The patient will require care spanning > 2 midnights and should be moved to inpatient because: management asthma exacerbation.     Consultants:  None  Procedures:  None  Antimicrobials:    Subjective: She had an episode of worsening SOB this am, when she wake up. She skip nebulizer last night.  She is  feeling better from earlier episode.    Objective: Vitals:   04/02/22 0812 04/02/22 0814 04/02/22 1258 04/02/22 1511  BP:   (!) 158/97   Pulse:   76   Resp:   18   Temp:   98.7 F (37.1 C)   TempSrc:      SpO2: 93% 93% 98% 98%    Intake/Output Summary (Last 24 hours) at 04/02/2022 1810 Last data filed at 04/02/2022 1049 Gross per 24 hour  Intake 578 ml  Output --  Net 578 ml    There were no vitals filed for this visit.  Examination:  General exam: NAD Respiratory system: BL expiratory wheezing.  Cardiovascular system: S 1, S 2 RRR  Gastrointestinal system: BS present, soft, nt Central nervous system: alert, follows command Extremities: no edema    Data Reviewed: I have personally reviewed following labs and imaging studies  CBC: Recent Labs  Lab 03/30/22 2009 03/30/22 2339  WBC 10.0 12.6*  NEUTROABS 6.2  --   HGB 15.3* 14.3  HCT 45.5 42.5  MCV 87.7 87.8  PLT 315 322    Basic Metabolic Panel: Recent Labs  Lab 03/30/22 2009 03/30/22 2339 03/31/22 0305 03/31/22 0440 04/02/22 0455  NA 141  --   --  138 136  K 2.9*  --   --  4.0 3.8  CL 107  --   --  106 106  CO2 23  --   --  21* 20*  GLUCOSE 119*  --   --  164* 116*  BUN 8  --   --  8 20  CREATININE  0.79 0.66  --  0.80 0.70  CALCIUM 9.7  --   --  9.9 9.8  MG  --   --  1.7  --   --     GFR: CrCl cannot be calculated (Unknown ideal weight.). Liver Function Tests: No results for input(s): "AST", "ALT", "ALKPHOS", "BILITOT", "PROT", "ALBUMIN" in the last 168 hours. No results for input(s): "LIPASE", "AMYLASE" in the last 168 hours. No results for input(s): "AMMONIA" in the last 168 hours. Coagulation Profile: No results for input(s): "INR", "PROTIME" in the last 168 hours. Cardiac Enzymes: No results for input(s): "CKTOTAL", "CKMB", "CKMBINDEX", "TROPONINI" in the last 168 hours. BNP (last 3 results) No results for input(s): "PROBNP" in the last 8760 hours. HbA1C: No results for input(s):  "HGBA1C" in the last 72 hours. CBG: No results for input(s): "GLUCAP" in the last 168 hours. Lipid Profile: No results for input(s): "CHOL", "HDL", "LDLCALC", "TRIG", "CHOLHDL", "LDLDIRECT" in the last 72 hours. Thyroid Function Tests: No results for input(s): "TSH", "T4TOTAL", "FREET4", "T3FREE", "THYROIDAB" in the last 72 hours. Anemia Panel: No results for input(s): "VITAMINB12", "FOLATE", "FERRITIN", "TIBC", "IRON", "RETICCTPCT" in the last 72 hours. Sepsis Labs: No results for input(s): "PROCALCITON", "LATICACIDVEN" in the last 168 hours.  Recent Results (from the past 240 hour(s))  Respiratory (~20 pathogens) panel by PCR     Status: Abnormal   Collection Time: 03/31/22  2:20 AM   Specimen: Anterior Nasal Swab; Respiratory  Result Value Ref Range Status   Adenovirus NOT DETECTED NOT DETECTED Final   Coronavirus 229E NOT DETECTED NOT DETECTED Final    Comment: (NOTE) The Coronavirus on the Respiratory Panel, DOES NOT test for the novel  Coronavirus (2019 nCoV)    Coronavirus HKU1 NOT DETECTED NOT DETECTED Final   Coronavirus NL63 NOT DETECTED NOT DETECTED Final   Coronavirus OC43 NOT DETECTED NOT DETECTED Final   Metapneumovirus NOT DETECTED NOT DETECTED Final   Rhinovirus / Enterovirus DETECTED (A) NOT DETECTED Final   Influenza A NOT DETECTED NOT DETECTED Final   Influenza B NOT DETECTED NOT DETECTED Final   Parainfluenza Virus 1 NOT DETECTED NOT DETECTED Final   Parainfluenza Virus 2 NOT DETECTED NOT DETECTED Final   Parainfluenza Virus 3 NOT DETECTED NOT DETECTED Final   Parainfluenza Virus 4 NOT DETECTED NOT DETECTED Final   Respiratory Syncytial Virus NOT DETECTED NOT DETECTED Final   Bordetella pertussis NOT DETECTED NOT DETECTED Final   Bordetella Parapertussis NOT DETECTED NOT DETECTED Final   Chlamydophila pneumoniae NOT DETECTED NOT DETECTED Final   Mycoplasma pneumoniae NOT DETECTED NOT DETECTED Final    Comment: Performed at [facility], [place].  [address] [place], [place] [address]  SARS Coronavirus 2 by RT PCR (hospital order, performed in [facility] hospital lab) *cepheid single result test* Anterior Nasal Swab     Status: None   Collection Time: 03/31/22  2:20 AM   Specimen: Anterior Nasal Swab  Result Value Ref Range Status   SARS Coronavirus 2 by RT PCR NEGATIVE NEGATIVE Final    Comment: (NOTE) SARS-CoV-2 target nucleic acids are NOT DETECTED.  The SARS-CoV-2 RNA is generally detectable in upper and lower respiratory specimens during the acute phase of infection. The lowest concentration of SARS-CoV-2 viral copies this assay can detect is 250 copies / mL. A negative result does not preclude SARS-CoV-2 infection and should not be used as the sole basis for treatment or other patient management decisions.  A negative result may occur with improper specimen collection /  handling, submission of specimen other than nasopharyngeal swab, presence of viral mutation(s) within the areas targeted by this assay, and inadequate number of viral copies (<250 copies / mL). A negative result must be combined with clinical observations, patient history, and epidemiological information.  Fact Sheet for Patients:   RoadLapTop.co.za  Fact Sheet for Healthcare Providers: http://kim-miller.com/  This test is not yet approved or  cleared by the [place] FDA and has been authorized for detection and/or diagnosis of SARS-CoV-2 by FDA under an Emergency Use Authorization (EUA).  This EUA will remain in effect (meaning this test can be used) for the duration of the COVID-19 declaration under Section 564(b)(1) of the Act, 21 U.S.C. section 360bbb-3(b)(1), unless the authorization is terminated or revoked sooner.  Performed at [facility], [address], [place], [place] [address]          Radiology Studies: No results found.      Scheduled Meds:  arformoterol  15  mcg Nebulization BID   budesonide (PULMICORT) nebulizer solution  0.25 mg Nebulization BID   enoxaparin (LOVENOX) injection  40 mg Subcutaneous Q24H   ipratropium-albuterol  3 mL Nebulization Q4H   losartan  100 mg Oral q morning   methylPREDNISolone (SOLU-MEDROL) injection  40 mg Intravenous Q12H   Continuous Infusions:   LOS: 2 days    Time spent: 35 minutes    Lahna Nath A Ashyr Hedgepath, MD [facility]   If 7PM-7AM, please contact night-coverage [email or web address]  04/02/2022, 6:10 PM

## 2022-04-03 ENCOUNTER — Other Ambulatory Visit (HOSPITAL_COMMUNITY): Payer: Self-pay

## 2022-04-03 MED ORDER — PREDNISONE 20 MG PO TABS: ORAL_TABLET | ORAL | 0 refills | Status: AC

## 2022-04-03 MED ORDER — FLUTICASONE-SALMETEROL 45-21 MCG/ACT IN AERO: 2.0000 | INHALATION_SPRAY | Freq: Two times a day (BID) | RESPIRATORY_TRACT | 12 refills | Status: DC

## 2022-04-03 MED ORDER — PANTOPRAZOLE SODIUM 40 MG PO TBEC: 40.0000 mg | DELAYED_RELEASE_TABLET | Freq: Two times a day (BID) | ORAL | 0 refills | Status: DC

## 2022-04-03 MED ORDER — AMLODIPINE BESYLATE 5 MG PO TABS
5.0000 mg | ORAL_TABLET | Freq: Every day | ORAL | Status: DC
  Administered 2022-04-03: 5 mg via ORAL
  Filled 2022-04-03: qty 1

## 2022-04-03 MED ORDER — ALBUTEROL SULFATE HFA 108 (90 BASE) MCG/ACT IN AERS
2.0000 | INHALATION_SPRAY | RESPIRATORY_TRACT | 1 refills | Status: AC | PRN
Start: 2022-04-03 — End: ?

## 2022-04-03 MED ORDER — AMLODIPINE BESYLATE 5 MG PO TABS: 5.0000 mg | ORAL_TABLET | Freq: Every day | ORAL | 2 refills | Status: DC

## 2022-04-03 MED ORDER — PANTOPRAZOLE SODIUM 40 MG PO TBEC: 40.0000 mg | DELAYED_RELEASE_TABLET | Freq: Two times a day (BID) | ORAL | 0 refills | Status: AC

## 2022-04-03 MED ORDER — AMLODIPINE BESYLATE 5 MG PO TABS: 5.0000 mg | ORAL_TABLET | Freq: Every day | ORAL | 2 refills | Status: AC

## 2022-04-03 MED ORDER — GUAIFENESIN-DM 100-10 MG/5ML PO SYRP: 5.0000 mL | ORAL_SOLUTION | ORAL | 0 refills | Status: DC | PRN

## 2022-04-03 MED ORDER — IPRATROPIUM-ALBUTEROL 0.5-2.5 (3) MG/3ML IN SOLN: 3.0000 mL | RESPIRATORY_TRACT | 0 refills | Status: AC

## 2022-04-03 MED ORDER — IPRATROPIUM-ALBUTEROL 0.5-2.5 (3) MG/3ML IN SOLN: 3.0000 mL | RESPIRATORY_TRACT | 0 refills | Status: DC

## 2022-04-03 MED ORDER — FLUTICASONE-SALMETEROL 45-21 MCG/ACT IN AERO: 2.0000 | INHALATION_SPRAY | Freq: Two times a day (BID) | RESPIRATORY_TRACT | 2 refills | Status: AC

## 2022-04-03 MED ORDER — ALBUTEROL SULFATE HFA 108 (90 BASE) MCG/ACT IN AERS: 2.0000 | INHALATION_SPRAY | RESPIRATORY_TRACT | 1 refills | Status: DC | PRN

## 2022-04-03 MED ORDER — PREDNISONE 20 MG PO TABS: ORAL_TABLET | ORAL | 0 refills | Status: DC

## 2022-04-03 MED ORDER — GUAIFENESIN-DM 100-10 MG/5ML PO SYRP
5.0000 mL | ORAL_SOLUTION | ORAL | 0 refills | Status: AC | PRN
Start: 2022-04-03 — End: ?

## 2022-04-03 NOTE — Discharge Summary (Signed)
Physician Discharge Summary   Patient: Denise Ballard MRN: [id number] DOB: Nov 09, 1970  Admit date:     03/30/2022  Discharge date: 04/03/22  Discharge Physician: Alba Cory   PCP: Felix Pacini, FNP   Recommendations at discharge:    Follow up on asthma , adjust medication as needed.   Discharge Diagnoses: Principal Problem:   Asthma exacerbation Active Problems:   Essential hypertension  Resolved Problems:   * No resolved hospital problems. *  Hospital Course: 51 year old with past medical history significant for asthma, ongoing tobacco abuse, hypertension presents complaining of worsening shortness of breath that is started the morning of admission.  One of the relative was sick for the last 2 days with upper respiratory tract infection.  She reports chest tightness and wheezing.   Patient admitted for asthma exacerbation. She slowly improved.   Assessment and Plan: 1-Acute asthma exacerbation/Acute Bronchitis.  In the setting of upper respiratory infection, rhinovirus, current  tobacco use She required  2 L oxygen. She will be off oxygen  Treated  with IV steroids Received  IV magnesium Treated  with Pulmicort and Brovana. Discharge on advair.  Continue with albuterol and ipratropium every 4 hours Robitussin as needed Started on  PPI Discharge on prednisone taper.  Out patient pulmonology referral made.    2-HTN; Continue with losartan Hold beta-blocker in the setting of asthma exacerbation Started on Norvasc.   3-Hypokalemia; Replaced.    Hypomagnesemia; Replaced.               Consultants: None Procedures performed: None Disposition: Home Diet recommendation:  Discharge Diet Orders (From admission, onward)     Start     Ordered   04/03/22 0000  Diet - low sodium heart healthy        04/03/22 1118           Cardiac diet DISCHARGE MEDICATION: Allergies as of 04/03/2022       Reactions   Latex Rash   Bumpy rash   Penicillins  Rash        Medication List     STOP taking these medications    famotidine 20 MG tablet Commonly known as: PEPCID   metoprolol succinate 100 MG 24 hr tablet Commonly known as: TOPROL-XL       TAKE these medications    albuterol 108 (90 Base) MCG/ACT inhaler Commonly known as: VENTOLIN HFA Inhale 2 puffs into the lungs every 4 (four) hours as needed for wheezing or shortness of breath. What changed: Another medication with the same name was removed. Continue taking this medication, and follow the directions you see here.   amLODipine 5 MG tablet Commonly known as: NORVASC Take 1 tablet (5 mg total) by mouth daily.   fluticasone-salmeterol 45-21 MCG/ACT inhaler Commonly known as: Advair HFA Inhale 2 puffs into the lungs 2 (two) times daily.   guaiFENesin-dextromethorphan 100-10 MG/5ML syrup Commonly known as: ROBITUSSIN DM Take 5 mLs by mouth every 4 (four) hours as needed for cough.   ipratropium-albuterol 0.5-2.5 (3) MG/3ML Soln Commonly known as: DUONEB Take 3 mLs by nebulization every 4 (four) hours.   losartan 100 MG tablet Commonly known as: COZAAR Take 100 mg by mouth every morning.   multivitamin with minerals Tabs tablet Take 1 tablet by mouth every morning.   pantoprazole 40 MG tablet Commonly known as: PROTONIX Take 1 tablet (40 mg total) by mouth 2 (two) times daily.   PATADAY OP Place 1 drop into both eyes 2 (two) times  daily.   predniSONE 20 MG tablet Commonly known as: DELTASONE Take 3 tablets for 2 days then 2 tablets for 3 days then 1 tablet for 2 days then stop.        Discharge Exam: There were no vitals filed for this visit. General; NAD Lung; sporadic wheezing.   Condition at discharge: good  The results of significant diagnostics from this hospitalization (including imaging, microbiology, ancillary and laboratory) are listed below for reference.   Imaging Studies: DG Chest Port 1 View  Result Date: 03/30/2022 CLINICAL  DATA:  Cough and shortness of breath. EXAM: PORTABLE CHEST 1 VIEW COMPARISON:  None Available. FINDINGS: The heart size is normal. No edema or effusion is present. No focal airspace disease is present. Visualized soft tissues and bony thorax are unremarkable. IMPRESSION: Negative one-view chest x-ray. Electronically Signed   By: Marin Roberts M.D.   On: 03/30/2022 20:46    Microbiology: Results for orders placed or performed during the hospital encounter of 03/30/22  Respiratory (~20 pathogens) panel by PCR     Status: Abnormal   Collection Time: 03/31/22  2:20 AM   Specimen: Anterior Nasal Swab; Respiratory  Result Value Ref Range Status   Adenovirus NOT DETECTED NOT DETECTED Final   Coronavirus 229E NOT DETECTED NOT DETECTED Final    Comment: (NOTE) The Coronavirus on the Respiratory Panel, DOES NOT test for the novel  Coronavirus (2019 nCoV)    Coronavirus HKU1 NOT DETECTED NOT DETECTED Final   Coronavirus NL63 NOT DETECTED NOT DETECTED Final   Coronavirus OC43 NOT DETECTED NOT DETECTED Final   Metapneumovirus NOT DETECTED NOT DETECTED Final   Rhinovirus / Enterovirus DETECTED (A) NOT DETECTED Final   Influenza A NOT DETECTED NOT DETECTED Final   Influenza B NOT DETECTED NOT DETECTED Final   Parainfluenza Virus 1 NOT DETECTED NOT DETECTED Final   Parainfluenza Virus 2 NOT DETECTED NOT DETECTED Final   Parainfluenza Virus 3 NOT DETECTED NOT DETECTED Final   Parainfluenza Virus 4 NOT DETECTED NOT DETECTED Final   Respiratory Syncytial Virus NOT DETECTED NOT DETECTED Final   Bordetella pertussis NOT DETECTED NOT DETECTED Final   Bordetella Parapertussis NOT DETECTED NOT DETECTED Final   Chlamydophila pneumoniae NOT DETECTED NOT DETECTED Final   Mycoplasma pneumoniae NOT DETECTED NOT DETECTED Final    Comment: Performed at [facility], [address], [place], [place] [address]  SARS Coronavirus 2 by RT PCR (hospital order, performed in [facility] hospital lab) *cepheid  single result test* Anterior Nasal Swab     Status: None   Collection Time: 03/31/22  2:20 AM   Specimen: Anterior Nasal Swab  Result Value Ref Range Status   SARS Coronavirus 2 by RT PCR NEGATIVE NEGATIVE Final    Comment: (NOTE) SARS-CoV-2 target nucleic acids are NOT DETECTED.  The SARS-CoV-2 RNA is generally detectable in upper and lower respiratory specimens during the acute phase of infection. The lowest concentration of SARS-CoV-2 viral copies this assay can detect is 250 copies / mL. A negative result does not preclude SARS-CoV-2 infection and should not be used as the sole basis for treatment or other patient management decisions.  A negative result may occur with improper specimen collection / handling, submission of specimen other than nasopharyngeal swab, presence of viral mutation(s) within the areas targeted by this assay, and inadequate number of viral copies (<250 copies / mL). A negative result must be combined with clinical observations, patient history, and epidemiological information.  Fact Sheet for Patients:  RoadLapTop.co.za  Fact Sheet for Healthcare Providers: http://kim-miller.com/  This test is not yet approved or  cleared by the [place] FDA and has been authorized for detection and/or diagnosis of SARS-CoV-2 by FDA under an Emergency Use Authorization (EUA).  This EUA will remain in effect (meaning this test can be used) for the duration of the COVID-19 declaration under Section 564(b)(1) of the Act, 21 U.S.C. section 360bbb-3(b)(1), unless the authorization is terminated or revoked sooner.  Performed at [facility], [address] [place], [place] [address]     Labs: CBC: Recent Labs  Lab 03/30/22 2009 03/30/22 2339  WBC 10.0 12.6*  NEUTROABS 6.2  --   HGB 15.3* 14.3  HCT 45.5 42.5  MCV 87.7 87.8  PLT 315 322   Basic Metabolic Panel: Recent Labs  Lab  03/30/22 2009 03/30/22 2339 03/31/22 0305 03/31/22 0440 04/02/22 0455  NA 141  --   --  138 136  K 2.9*  --   --  4.0 3.8  CL 107  --   --  106 106  CO2 23  --   --  21* 20*  GLUCOSE 119*  --   --  164* 116*  BUN 8  --   --  8 20  CREATININE 0.79 0.66  --  0.80 0.70  CALCIUM 9.7  --   --  9.9 9.8  MG  --   --  1.7  --   --    Liver Function Tests: No results for input(s): "AST", "ALT", "ALKPHOS", "BILITOT", "PROT", "ALBUMIN" in the last 168 hours. CBG: No results for input(s): "GLUCAP" in the last 168 hours.  Discharge time spent: greater than 30 minutes.  Signed: Alba Cory, MD [facility] 04/03/2022

## 2022-04-03 NOTE — Progress Notes (Signed)
SATURATION QUALIFICATIONS: (This note is used to comply with regulatory documentation for home oxygen)  Patient Saturations on Room Air at Rest = 94%  Patient Saturations on Room Air while Ambulating = 97%  Patient Saturations on na Liters of oxygen while Ambulating = na%  Please briefly explain why patient needs home oxygen:

## 2022-04-03 NOTE — Progress Notes (Signed)
PIV removed. Discharge instructions completed. Patient verbalized understanding of medication regimen, follow up appointments and discharge instructions. Patient belongings gathered and packed to discharge.  

## 2022-04-03 NOTE — Plan of Care (Signed)

## 2022-04-03 NOTE — Discharge Summary (Signed)
Physician Discharge Summary   Patient: Wendy Preston MRN: [id number] DOB: Nov 09, 1970  Admit date:     03/30/2022  Discharge date: 04/03/22  Discharge Physician: Alba Cory   PCP: Felix Pacini, FNP   Recommendations at discharge:    Follow up on asthma , adjust medication as needed.   Discharge Diagnoses: Principal Problem:   Asthma exacerbation Active Problems:   Essential hypertension  Resolved Problems:   * No resolved hospital problems. *  Hospital Course: 51 year old with past medical history significant for asthma, ongoing tobacco abuse, hypertension presents complaining of worsening shortness of breath that is started the morning of admission.  One of the relative was sick for the last 2 days with upper respiratory tract infection.  She reports chest tightness and wheezing.   Patient admitted for asthma exacerbation. She slowly improved.   Assessment and Plan: 1-Acute asthma exacerbation/Acute Bronchitis.  In the setting of upper respiratory infection, rhinovirus, current  tobacco use She required  2 L oxygen. She will be off oxygen  Treated  with IV steroids Received  IV magnesium Treated  with Pulmicort and Brovana. Discharge on advair.  Continue with albuterol and ipratropium every 4 hours Robitussin as needed Started on  PPI Discharge on prednisone taper.  Out patient pulmonology referral made.    2-HTN; Continue with losartan Hold beta-blocker in the setting of asthma exacerbation Started on Norvasc.   3-Hypokalemia; Replaced.    Hypomagnesemia; Replaced.               Consultants: None Procedures performed: None Disposition: Home Diet recommendation:  Discharge Diet Orders (From admission, onward)     Start     Ordered   04/03/22 0000  Diet - low sodium heart healthy        04/03/22 1118           Cardiac diet DISCHARGE MEDICATION: Allergies as of 04/03/2022       Reactions   Latex Rash   Bumpy rash   Penicillins  Rash        Medication List     STOP taking these medications    famotidine 20 MG tablet Commonly known as: PEPCID   metoprolol succinate 100 MG 24 hr tablet Commonly known as: TOPROL-XL       TAKE these medications    albuterol 108 (90 Base) MCG/ACT inhaler Commonly known as: VENTOLIN HFA Inhale 2 puffs into the lungs every 4 (four) hours as needed for wheezing or shortness of breath. What changed: Another medication with the same name was removed. Continue taking this medication, and follow the directions you see here.   amLODipine 5 MG tablet Commonly known as: NORVASC Take 1 tablet (5 mg total) by mouth daily.   fluticasone-salmeterol 45-21 MCG/ACT inhaler Commonly known as: Advair HFA Inhale 2 puffs into the lungs 2 (two) times daily.   guaiFENesin-dextromethorphan 100-10 MG/5ML syrup Commonly known as: ROBITUSSIN DM Take 5 mLs by mouth every 4 (four) hours as needed for cough.   ipratropium-albuterol 0.5-2.5 (3) MG/3ML Soln Commonly known as: DUONEB Take 3 mLs by nebulization every 4 (four) hours.   losartan 100 MG tablet Commonly known as: COZAAR Take 100 mg by mouth every morning.   multivitamin with minerals Tabs tablet Take 1 tablet by mouth every morning.   pantoprazole 40 MG tablet Commonly known as: PROTONIX Take 1 tablet (40 mg total) by mouth 2 (two) times daily.   PATADAY OP Place 1 drop into both eyes 2 (two) times  daily.   predniSONE 20 MG tablet Commonly known as: DELTASONE Take 3 tablets for 2 days then 2 tablets for 3 days then 1 tablet for 2 days then stop.        Discharge Exam: There were no vitals filed for this visit. General; NAD Lung; sporadic wheezing.   Condition at discharge: good  The results of significant diagnostics from this hospitalization (including imaging, microbiology, ancillary and laboratory) are listed below for reference.   Imaging Studies: DG Chest Port 1 View  Result Date: 03/30/2022 CLINICAL  DATA:  Cough and shortness of breath. EXAM: PORTABLE CHEST 1 VIEW COMPARISON:  None Available. FINDINGS: The heart size is normal. No edema or effusion is present. No focal airspace disease is present. Visualized soft tissues and bony thorax are unremarkable. IMPRESSION: Negative one-view chest x-ray. Electronically Signed   By: Marin Roberts M.D.   On: 03/30/2022 20:46    Microbiology: Results for orders placed or performed during the hospital encounter of 03/30/22  Respiratory (~20 pathogens) panel by PCR     Status: Abnormal   Collection Time: 03/31/22  2:20 AM   Specimen: Anterior Nasal Swab; Respiratory  Result Value Ref Range Status   Adenovirus NOT DETECTED NOT DETECTED Final   Coronavirus 229E NOT DETECTED NOT DETECTED Final    Comment: (NOTE) The Coronavirus on the Respiratory Panel, DOES NOT test for the novel  Coronavirus (2019 nCoV)    Coronavirus HKU1 NOT DETECTED NOT DETECTED Final   Coronavirus NL63 NOT DETECTED NOT DETECTED Final   Coronavirus OC43 NOT DETECTED NOT DETECTED Final   Metapneumovirus NOT DETECTED NOT DETECTED Final   Rhinovirus / Enterovirus DETECTED (A) NOT DETECTED Final   Influenza A NOT DETECTED NOT DETECTED Final   Influenza B NOT DETECTED NOT DETECTED Final   Parainfluenza Virus 1 NOT DETECTED NOT DETECTED Final   Parainfluenza Virus 2 NOT DETECTED NOT DETECTED Final   Parainfluenza Virus 3 NOT DETECTED NOT DETECTED Final   Parainfluenza Virus 4 NOT DETECTED NOT DETECTED Final   Respiratory Syncytial Virus NOT DETECTED NOT DETECTED Final   Bordetella pertussis NOT DETECTED NOT DETECTED Final   Bordetella Parapertussis NOT DETECTED NOT DETECTED Final   Chlamydophila pneumoniae NOT DETECTED NOT DETECTED Final   Mycoplasma pneumoniae NOT DETECTED NOT DETECTED Final    Comment: Performed at [facility], [address], [place], [place] [address]  SARS Coronavirus 2 by RT PCR (hospital order, performed in [facility] hospital lab) *cepheid  single result test* Anterior Nasal Swab     Status: None   Collection Time: 03/31/22  2:20 AM   Specimen: Anterior Nasal Swab  Result Value Ref Range Status   SARS Coronavirus 2 by RT PCR NEGATIVE NEGATIVE Final    Comment: (NOTE) SARS-CoV-2 target nucleic acids are NOT DETECTED.  The SARS-CoV-2 RNA is generally detectable in upper and lower respiratory specimens during the acute phase of infection. The lowest concentration of SARS-CoV-2 viral copies this assay can detect is 250 copies / mL. A negative result does not preclude SARS-CoV-2 infection and should not be used as the sole basis for treatment or other patient management decisions.  A negative result may occur with improper specimen collection / handling, submission of specimen other than nasopharyngeal swab, presence of viral mutation(s) within the areas targeted by this assay, and inadequate number of viral copies (<250 copies / mL). A negative result must be combined with clinical observations, patient history, and epidemiological information.  Fact Sheet for Patients:  RoadLapTop.co.za  Fact Sheet for Healthcare Providers: http://kim-miller.com/  This test is not yet approved or  cleared by the [place] FDA and has been authorized for detection and/or diagnosis of SARS-CoV-2 by FDA under an Emergency Use Authorization (EUA).  This EUA will remain in effect (meaning this test can be used) for the duration of the COVID-19 declaration under Section 564(b)(1) of the Act, 21 U.S.C. section 360bbb-3(b)(1), unless the authorization is terminated or revoked sooner.  Performed at [facility], [address] [place], [place] [address]     Labs: CBC: Recent Labs  Lab 03/30/22 2009 03/30/22 2339  WBC 10.0 12.6*  NEUTROABS 6.2  --   HGB 15.3* 14.3  HCT 45.5 42.5  MCV 87.7 87.8  PLT 315 322   Basic Metabolic Panel: Recent Labs  Lab  03/30/22 2009 03/30/22 2339 03/31/22 0305 03/31/22 0440 04/02/22 0455  NA 141  --   --  138 136  K 2.9*  --   --  4.0 3.8  CL 107  --   --  106 106  CO2 23  --   --  21* 20*  GLUCOSE 119*  --   --  164* 116*  BUN 8  --   --  8 20  CREATININE 0.79 0.66  --  0.80 0.70  CALCIUM 9.7  --   --  9.9 9.8  MG  --   --  1.7  --   --    Liver Function Tests: No results for input(s): "AST", "ALT", "ALKPHOS", "BILITOT", "PROT", "ALBUMIN" in the last 168 hours. CBG: No results for input(s): "GLUCAP" in the last 168 hours.  Discharge time spent: greater than 30 minutes.  Signed: Alba Cory, MD [facility] 04/03/2022

## 2023-01-25 NOTE — Progress Notes (Signed)
 INTERNAL MEDICINE & PRIMARY CARE  Primary Care Physician: Felisa Galloway, MD  Date of follow-up:  01/28/23     History of Present Illness:  Wendy Preston is a 52 y.o. female presenting for follow-up visit.  Her blood pressure has been generally 120- 140s systolic over 90s diastolic.    Last menses was January 2022.     Past Medical History:   Diagnosis Date   . Essential hypertension    . Hypercalcemia    . Nicotine dependence    . Reflux esophagitis    . Rhinitis    . Vitamin D deficiency       Past Surgical History:   Procedure Laterality Date   . CESAREAN SECTION, CLASSIC         Family History   Problem Relation Age of Onset   . Cervical cancer Mother    . No Known Problems Father       Wendy Preston  reports that she has been smoking cigarettes. She uses smokeless tobacco. She reports that she does not currently use alcohol. She reports that she does not use drugs.     Current Outpatient Medications on File Prior to Visit   Medication Sig   . albuterol  HFA (ProAir  HFA) 108 (90 Base) MCG/ACT inhaler Inhale 1 puff every 6 (six) hours if needed for wheezing   . amLODIPine  (NORVASC ) 10 MG tablet Take 1 tablet (10 mg total) by mouth 1 (one) time each day   . Black Pepper-Turmeric (Turmeric Curcumin) 01-999 MG capsule    . budesonide-formoterol (SYMBICORT) 160-4.5 MCG/ACT inhaler Inhale 2 puffs  in the morning and 2 puffs before bedtime.   . ergocalciferol (VITAMIN D-2) 1.25 MG (50000 UT) capsule Take 1 capsule (50,000 Units total) by mouth 1 (one) time per week   . fexofenadine (ALLEGRA) 180 MG tablet Take 1 tablet (180 mg total) by mouth 1 (one) time each day   . losartan (COZAAR) 100 MG tablet Take 1 tablet (100 mg total) by mouth 1 (one) time each day   . olopatadine (PATANOL) 0.1 % ophthalmic solution Administer 1 drop into affected eye(s) 2 (two) times a day if needed for allergies   . oxymetazoline  (AFRIN) 0.05 % nasal spray Administer 2 sprays into affected nostril(s) in the morning and 2 sprays in the  evening.   . pantoprazole  (PROTONIX ) 40 MG EC tablet Take 1 tablet (40 mg total) by mouth in the morning and 1 tablet (40 mg total) in the evening.     No current facility-administered medications on file prior to visit.       Wendy Preston is allergic to latex and penicillins.    Immunization History   Administered Date(s) Administered   . Influenza TIV (IM) 07/01/2020        Review of Systems   Constitutional:  Negative for chills, fatigue, fever and unexpected weight change.   Respiratory:  Negative for cough, chest tightness and shortness of breath.    Cardiovascular:  Negative for chest pain, palpitations and leg swelling.   Gastrointestinal:  Negative for abdominal distention, abdominal pain, blood in stool, constipation, diarrhea, nausea and vomiting.   Genitourinary:  Positive for bladder incontinence, dysuria, flank pain, hematuria and urgency. Negative for frequency, nocturia, vaginal bleeding and vaginal discharge.   Musculoskeletal:  Positive for back pain. Negative for myalgias.   Neurological:  Negative for dizziness, weakness and headaches.       Vital Signs:  BP 130/90   Pulse 72   Temp  98 F (36.7 C)   SpO2 97%   BMI 34.97 kg/m   Wt Readings from Last 6 Encounters:   01/25/23 230 lb (104 kg)   12/28/22 228 lb (103 kg)   08/29/22 230 lb (104 kg)   12/22/20 241 lb (109 kg)   12/01/20 241 lb (109 kg)   11/03/20 246 lb (112 kg)          Physical Exam  Constitutional:       Appearance: She is well-developed and well-nourished.   HENT:      Head: Normocephalic.   Eyes:      Pupils: Pupils are equal, round, and reactive to light.   Neck:      Musculoskeletal: Normal range of motion.   Cardiovascular:      Rate and Rhythm: Normal rate and regular rhythm.   Pulmonary:      Effort: Pulmonary effort is normal.      Breath sounds: Normal breath sounds.   Abdominal:      General: Bowel sounds are normal.      Palpations: Abdomen is soft.   Musculoskeletal:         General: No edema. Normal range of motion.    Skin:     General: Skin is warm.   Neurological:      Mental Status: She is oriented to person, place, and time.          Labs and Imaging:    Results from last 36 months   Lab Units 01/25/23  0952 08/29/22  1220   CREATININE mg/dL 9.25 9.32   BLOOD UREA NITROGEN (BUN) mg/dL 10 8   SODIUM, SERUM/PLASMA mmol/L 137 141   POTASSIUM mmol/L 3.9 3.7   CHLORIDE mmol/L 104 107   CARBON DIOXIDE (CO2) mmol/L 25 23   EGFR mL/min/1.31m2 98 106   GLUCOSE mg/dL 894* 89   CALCIUM mg/dL 89.3* 89.1*   ALBUMIN g/dL 4.5 4.4       Results from last 36 months   Lab Units 01/25/23  0952 08/29/22  1220   ALBUMIN/GLOBULIN, SERUM/PLASMA (calc) 1.5 1.4   BILIRUBIN, TOTAL, SERUM/PLASMA mg/dL 0.6 0.5   ALKALINE PHOSPHATASE U/L 113 101   ASPARTATE AMINOTRANSFERASE (AST), SERUM/PLASMA U/L 17 17   ALANINE AMINOTRANSFERASE (ALT), SERUM/PLASMA U/L 20 16   ALBUMIN g/dL 4.5 4.4   PROTEIN g/dL 7.5 7.5       Results from last 36 months   Lab Units 01/25/23  0952 08/29/22  1220   CALCIDIOL, SERUM/PLASMA ng/mL 47 23*             Results from last 36 months   Lab Units 01/25/23  0952 08/29/22  1220   LEUKOCYTES (WBC) Thousand/uL 4.6 5.0   HEMOGLOBIN (HGB) g/dL 85.3 85.6   HEMATROCRIT (HCT) % 44.6 42.1   PLATELETS Thousand/uL 357 372       Results from last 36 months   Lab Units 01/25/23  0952 08/29/22  1220   HEMOGLOBIN (HGB) URINE  NEGATIVE NEGATIVE   PROTEIN, URINE  NEGATIVE NEGATIVE   NITRIES, URINE  NEGATIVE NEGATIVE   LEUKOCYTES, URINE SEDIMENT /HPF CANCELED CANCELED   ERYTHROCYTES (RBC), URINE SEDIMENT /HPF CANCELED CANCELED   BACTERIA, URINE SEDIMENT /HPF CANCELED CANCELED   CULTURE, URINE, ROUTINE   --  SEE NOTE*   KETONES, URINE  NEGATIVE TRACE*   CRYSTALS, URINE SEDIMENT  /HPF CANCELED CANCELED   URATE CRYSTALS, URINE SEDIMENT /HPF CANCELED CANCELED   YEAST, URINE SEDIMENT /HPF CANCELED CANCELED  GRANULAR CASTS, URINE SEDIMENT /LPF CANCELED CANCELED   CASTS, URINE SEDIMENT /LPF CANCELED CANCELED       Results from last 36 months   Lab Units  08/29/22  1220   TSH, SERUM/PLASMA mIU/L 1.45       Results from last 36 months   Lab Units 01/25/23  0952 08/29/22  1220   CHOLESTEROL, SERUM/PLASMA mg/dL 797* 816   CHOLESTEROL, HDL, SERUM/PLASMA mg/dL 59 58   CHOLESTEROL, LDL, SERUM/PLASMA mg/dL (calc) 877* 891*   TRIGLYCERIDE, SERUM/PLASMA mg/dL 899 82       Results from last 36 months   Lab Units 01/25/23  0952 08/29/22  1220   CHOLESTEROL, SERUM/PLASMA mg/dL 797* 816   CHOLESTEROL, HDL, SERUM/PLASMA mg/dL 59 58   TRIGLYCERIDE, SERUM/PLASMA mg/dL 899 82   CHOLESTEROL, LDL, SERUM/PLASMA mg/dL (calc) 877* 891*   CHOLESTEROL NON HDL, SERUM/PLASMA mg/dL (calc) 856* 874       Orders Only on 01/25/2023   Component Date Value Ref Range Status   . Cholesterol, Serum/Plasma 01/25/2023 202 (H)  <200 mg/dL Final   . Cholesterol, HDL, Serum/Plasma 01/25/2023 59  > OR = 50 mg/dL Final   . Triglyceride, Serum/Plasma 01/25/2023 100  <150 mg/dL Final   . Cholesterol, LDL, Serum/Plasma 01/25/2023 122 (H)  mg/dL (calc) Final    Comment: Reference range: <100     Desirable range <100 mg/dL for primary prevention;    <70 mg/dL for patients with CHD or diabetic patients   with > or = 2 CHD risk factors.     LDL-C is now calculated using the Martin-Hopkins   calculation, which is a validated novel method providing   better accuracy than the Friedewald equation in the   estimation of LDL-C.   Gladis APPLETHWAITE et al. SANDREA. 7986;689(80): 2061-2068   (http://education.[facility].com/faq/FAQ164)     . Cholesterol, total/Cholesterol, HD* 01/25/2023 3.4  <5.0 (calc) Final   . Cholesterol non HDL, Serum/Plasma 01/25/2023 143 (H)  <130 mg/dL (calc) Final    Comment: For patients with diabetes plus 1 major ASCVD risk   factor, treating to a non-HDL-C goal of <100 mg/dL   (LDL-C of <29 mg/dL) is considered a therapeutic   option.     . Glucose, Serum/Plasma 01/25/2023 105 (H)  65 - 99 mg/dL Final    Comment:               Fasting reference interval     For someone without known diabetes, a  glucose value  between 100 and 125 mg/dL is consistent with  prediabetes and should be confirmed with a  follow-up test.        . Urea nitrogen, Serum/Plasma (BUN) 01/25/2023 10  7 - 25 mg/dL Final   . Creatinine, Serum/Plasma 01/25/2023 0.74  0.50 - 1.03 mg/dL Final   . Estimated Glomerular Filtration Ra* 01/25/2023 98  > OR = 60 mL/min/1.72m2 Final   . Urea nitrogen/Creatinine, Serum/Pl* 01/25/2023 SEE NOTE:  6 - 22 (calc) Final    Comment:    Not Reported: BUN and Creatinine are within     reference range.           . Sodium, Serum/Plasma 01/25/2023 137  135 - 146 mmol/L Final   . Potassium, Serum/Plasma 01/25/2023 3.9  3.5 - 5.3 mmol/L Final   . Chloride, Serum/Plasma 01/25/2023 104  98 - 110 mmol/L Final   . Carbon dioxide CO2), total, Serum/* 01/25/2023 25  20 - 32 mmol/L Final   . Calcium, Serum/Plasma 01/25/2023  10.6 (H)  8.6 - 10.4 mg/dL Final   . Protein, Serum/Plasma 01/25/2023 7.5  6.1 - 8.1 g/dL Final   . Albumin, Serum/Plasma 01/25/2023 4.5  3.6 - 5.1 g/dL Final   . Globulin, Serum 01/25/2023 3.0  1.9 - 3.7 g/dL (calc) Final   . Albumin/Globulin, Serum/Plasma 01/25/2023 1.5  1.0 - 2.5 (calc) Final   . Bilirubin, total, Serum/Plasma 01/25/2023 0.6  0.2 - 1.2 mg/dL Final   . Alkaline phosphatase, Serum/Plasma 01/25/2023 113  37 - 153 U/L Final   . Aspartate Aminotransferase (AST), * 01/25/2023 17  10 - 35 U/L Final   . Alanine Aminotransferase (ALT), Se* 01/25/2023 20  6 - 29 U/L Final   . Leukocytes, Blood 01/25/2023 4.6  3.8 - 10.8 Thousand/uL Final   . Erythrocytes (RBC) 01/25/2023 5.04  3.80 - 5.10 Million/uL Final   . Hemoglobin (HGB) 01/25/2023 14.6  11.7 - 15.5 g/dL Final   . Hematocrit (HCT) 01/25/2023 44.6  35.0 - 45.0 % Final   . MCV 01/25/2023 88.5  80.0 - 100.0 fL Final   . MCH 01/25/2023 29.0  27.0 - 33.0 pg Final   . MCHC 01/25/2023 32.7  32.0 - 36.0 g/dL Final   . Erythrocyte Distribution Width (RD* 01/25/2023 14.4  11.0 - 15.0 % Final   . Platelets, Blood 01/25/2023 357  140 - 400  Thousand/uL Final   . Platelet mean volume, Blood 01/25/2023 9.8  7.5 - 12.5 fL Final   . Neutrophils, Blood 01/25/2023 1,762  1,500 - 7,800 cells/uL Final   . Band form neutrophils, Blood 01/25/2023 CANCELED  0 - 750 cells/uL Final    Result canceled by the ancillary.   . Metamyelocytes, Blood 01/25/2023 CANCELED  0 cells/uL Final    Result canceled by the ancillary.   . Myelocytes, Blood 01/25/2023 CANCELED  0 cells/uL Final    Result canceled by the ancillary.   . Promyelocytes, Blood 01/25/2023 CANCELED  0 cells/uL Final    Result canceled by the ancillary.   . Lymphocytes, Blood 01/25/2023 2,438  850 - 3,900 cells/uL Final   . Monocytes, Blood 01/25/2023 290  200 - 950 cells/uL Final   . Eosinophils, Blood 01/25/2023 92  15 - 500 cells/uL Final   . Basophils, Blood 01/25/2023 18  0 - 200 cells/uL Final   . Blasts, Blood 01/25/2023 CANCELED  0 cells/uL Final    Result canceled by the ancillary.   . Nucleated erythrocytes, Blood 01/25/2023 CANCELED  0 cells/uL Final    Result canceled by the ancillary.   . Neutrophils/100 leukocytes, Blood 01/25/2023 38.3  % Final   . Band form neutrophils/100 leukocyt* 01/25/2023 CANCELED  % Final    Result canceled by the ancillary.   . Metamyelocytes/100 leukocytes, Blo* 01/25/2023 CANCELED  % Final    Result canceled by the ancillary.   . Myelocytes/100 leukocytes, Blood 01/25/2023 CANCELED  % Final    Result canceled by the ancillary.   . Promyelocytes/100 leukocytes, Blood 01/25/2023 CANCELED  % Final    Result canceled by the ancillary.   . Lymphocytes/100 leukocytes, Blood 01/25/2023 53.0  % Final   . Variant lymphocytes/100 leukocytes* 01/25/2023 CANCELED  0 - 10 % Final    Result canceled by the ancillary.   . Monocytes/100 leukocytes, Blood 01/25/2023 6.3  % Final   . Eosinophils/100 leukocytes, Blood 01/25/2023 2.0  % Final   . Basophils/100 leukocytes, Blood 01/25/2023 0.4  % Final   . Blasts/100 leukocytes, Blood 01/25/2023 CANCELED  % Final  Result canceled by  the ancillary.   . Nucleated erythrocytes/100 leukocy* 01/25/2023 CANCELED  0 /100 WBC Final    Result canceled by the ancillary.   . Service comment 01/25/2023 CANCELED   Final    Result canceled by the ancillary.   . Calcidiol, Serum/Plasma 01/25/2023 47  30 - 100 ng/mL Final    Comment: Vitamin D Status         25-OH Vitamin D:     Deficiency:                    <20 ng/mL  Insufficiency:             20 - 29 ng/mL  Optimal:                 > or = 30 ng/mL     For 25-OH Vitamin D testing on patients on   D2-supplementation and patients for whom quantitation   of D2 and D3 fractions is required, the [facility](TM)  25-OH VIT D, (D2,D3), LC/MS/MS is recommended: order   code 07111 (patients >32yrs).     See Note 1     Note 1     For additional information, please refer to   http://education.[facility].com/faq/FAQ199   (This link is being provided for informational/  educational purposes only.)     . Hemoglobin A1c/Hemoglobin, total, * 01/25/2023 5.8 (H)  <5.7 % of total Hgb Final    Comment: For someone without known diabetes, a hemoglobin   A1c value between 5.7% and 6.4% is consistent with  prediabetes and should be confirmed with a   follow-up test.     For someone with known diabetes, a value <7%  indicates that their diabetes is well controlled. A1c  targets should be individualized based on duration of  diabetes, age, comorbid conditions, and other  considerations.     This assay result is consistent with an increased risk  of diabetes.     Currently, no consensus exists regarding use of  hemoglobin A1c for diagnosis of diabetes for children.        . Color of Urine 01/25/2023 YELLOW  YELLOW Final   . Appearance of Urine 01/25/2023 CLEAR  CLEAR Final   . Specific gravity of Urine 01/25/2023 1.005  1.001 - 1.035 Final   . pH of Urine 01/25/2023 7.0  5.0 - 8.0 Final   . Glucose, Urine 01/25/2023 NEGATIVE  NEGATIVE Final   . Bilirubin, total, Urine 01/25/2023 NEGATIVE  NEGATIVE Final   . Ketones, Urine  01/25/2023 NEGATIVE  NEGATIVE Final   . Hemoglobin, Urine 01/25/2023 NEGATIVE  NEGATIVE Final   . Protein, Urine 01/25/2023 NEGATIVE  NEGATIVE Final   . Nitrite, Urine 01/25/2023 NEGATIVE  NEGATIVE Final   . Leukocyte esterase, Urine 01/25/2023 NEGATIVE  NEGATIVE Final   . Leukocytes, Urine sediment 01/25/2023 CANCELED  < OR = 5 /HPF Final    Result canceled by the ancillary.   . Erythrocytes, Urine sediment 01/25/2023 CANCELED  < OR = 2 /HPF Final    Result canceled by the ancillary.   SABRA Epithelial cells, squamous, Urine * 01/25/2023 CANCELED  < OR = 5 /HPF Final    Result canceled by the ancillary.   . Transitional cells, Urine sediment 01/25/2023 CANCELED  < OR = 5 /HPF Final    Result canceled by the ancillary.   SABRA Epithelial cells, renal, Urine sed* 01/25/2023 CANCELED  < OR = 3 /HPF Final    Result canceled  by the ancillary.   . Bacteria, Urine sediment 01/25/2023 CANCELED  NONE SEEN /HPF Final    Result canceled by the ancillary.   . Calcium oxalate crystals, Urine se* 01/25/2023 CANCELED  NONE OR FEW /HPF Final    Result canceled by the ancillary.   . Triple phosphate crystals, Urine s* 01/25/2023 CANCELED  NONE OR FEW /HPF Final    Result canceled by the ancillary.   . Urate crystals, Urine sediment 01/25/2023 CANCELED  NONE OR FEW /HPF Final    Result canceled by the ancillary.   . Amorphous sediment, Urine sediment 01/25/2023 CANCELED  NONE OR FEW /HPF Final    Result canceled by the ancillary.   . Crystals, Urine sediment 01/25/2023 CANCELED  NONE SEEN  /HPF Final    Result canceled by the ancillary.   SABRA Hyaline casts, Urine sediment 01/25/2023 CANCELED  NONE SEEN /LPF Final    Result canceled by the ancillary.   . Granular casts, Urine sediment 01/25/2023 CANCELED  NONE SEEN /LPF Final    Result canceled by the ancillary.   . Casts, Urine sediment 01/25/2023 CANCELED  NONE SEEN /LPF Final    Result canceled by the ancillary.   SABRA Leaf, Urine sediment 01/25/2023 CANCELED  NONE SEEN /HPF Final    Result  canceled by the ancillary.   . Service comment 01/25/2023 CANCELED   Final    Result canceled by the ancillary.   Office Visit on 08/29/2022   Component Date Value Ref Range Status   . Leukocytes, Blood 08/29/2022 5.0  3.8 - 10.8 Thousand/uL Final   . Erythrocytes (RBC) 08/29/2022 4.80  3.80 - 5.10 Million/uL Final   . Hemoglobin (HGB) 08/29/2022 14.3  11.7 - 15.5 g/dL Final   . Hematocrit (HCT) 08/29/2022 42.1  35.0 - 45.0 % Final   . MCV 08/29/2022 87.7  80.0 - 100.0 fL Final   . MCH 08/29/2022 29.8  27.0 - 33.0 pg Final   . MCHC 08/29/2022 34.0  32.0 - 36.0 g/dL Final   . Erythrocyte Distribution Width (RD* 08/29/2022 13.6  11.0 - 15.0 % Final   . Platelets, Blood 08/29/2022 372  140 - 400 Thousand/uL Final   . Platelet mean volume, Blood 08/29/2022 11.1  7.5 - 12.5 fL Final   . Neutrophils, Blood 08/29/2022 1,730  1,500 - 7,800 cells/uL Final   . Band form neutrophils, Blood 08/29/2022 CANCELED  0 - 750 cells/uL Final    Result canceled by the ancillary.   . Metamyelocytes, Blood 08/29/2022 CANCELED  0 cells/uL Final    Result canceled by the ancillary.   . Myelocytes, Blood 08/29/2022 CANCELED  0 cells/uL Final    Result canceled by the ancillary.   . Promyelocytes, Blood 08/29/2022 CANCELED  0 cells/uL Final    Result canceled by the ancillary.   . Lymphocytes, Blood 08/29/2022 2,790  850 - 3,900 cells/uL Final   . Monocytes, Blood 08/29/2022 310  200 - 950 cells/uL Final   . Eosinophils, Blood 08/29/2022 150  15 - 500 cells/uL Final   . Basophils, Blood 08/29/2022 20  0 - 200 cells/uL Final   . Blasts, Blood 08/29/2022 CANCELED  0 cells/uL Final    Result canceled by the ancillary.   . Nucleated erythrocytes, Blood 08/29/2022 CANCELED  0 cells/uL Final    Result canceled by the ancillary.   . Neutrophils/100 leukocytes, Blood 08/29/2022 34.6  % Final   . Band form neutrophils/100 leukocyt* 08/29/2022 CANCELED  % Final  Result canceled by the ancillary.   . Metamyelocytes/100 leukocytes, Blo* 08/29/2022  CANCELED  % Final    Result canceled by the ancillary.   . Myelocytes/100 leukocytes, Blood 08/29/2022 CANCELED  % Final    Result canceled by the ancillary.   . Promyelocytes/100 leukocytes, Blood 08/29/2022 CANCELED  % Final    Result canceled by the ancillary.   . Lymphocytes/100 leukocytes, Blood 08/29/2022 55.8  % Final   . Variant lymphocytes/100 leukocytes* 08/29/2022 CANCELED  0 - 10 % Final    Result canceled by the ancillary.   . Monocytes/100 leukocytes, Blood 08/29/2022 6.2  % Final   . Eosinophils/100 leukocytes, Blood 08/29/2022 3.0  % Final   . Basophils/100 leukocytes, Blood 08/29/2022 0.4  % Final   . Blasts/100 leukocytes, Blood 08/29/2022 CANCELED  % Final    Result canceled by the ancillary.   . Nucleated erythrocytes/100 leukocy* 08/29/2022 CANCELED  0 /100 WBC Final    Result canceled by the ancillary.   . Service comment 08/29/2022 CANCELED   Final    Result canceled by the ancillary.   . Glucose, Serum/Plasma 08/29/2022 89  65 - 99 mg/dL Final    Comment:               Fasting reference interval        . Urea nitrogen, Serum/Plasma (BUN) 08/29/2022 8  7 - 25 mg/dL Final   . Creatinine, Serum/Plasma 08/29/2022 0.67  0.50 - 1.03 mg/dL Final   . Estimated Glomerular Filtration Ra* 08/29/2022 106  > OR = 60 mL/min/1.76m2 Final   . Urea nitrogen/Creatinine, Serum/Pl* 08/29/2022 SEE NOTE:  6 - 22 (calc) Final    Comment:    Not Reported: BUN and Creatinine are within     reference range.           . Sodium, Serum/Plasma 08/29/2022 141  135 - 146 mmol/L Final   . Potassium, Serum/Plasma 08/29/2022 3.7  3.5 - 5.3 mmol/L Final   . Chloride, Serum/Plasma 08/29/2022 107  98 - 110 mmol/L Final   . Carbon dioxide CO2), total, Serum/* 08/29/2022 23  20 - 32 mmol/L Final   . Calcium, Serum/Plasma 08/29/2022 10.8 (H)  8.6 - 10.4 mg/dL Final   . Protein, Serum/Plasma 08/29/2022 7.5  6.1 - 8.1 g/dL Final   . Albumin, Serum/Plasma 08/29/2022 4.4  3.6 - 5.1 g/dL Final   . Globulin, Serum 08/29/2022 3.1  1.9 -  3.7 g/dL (calc) Final   . Albumin/Globulin, Serum/Plasma 08/29/2022 1.4  1.0 - 2.5 (calc) Final   . Bilirubin, total, Serum/Plasma 08/29/2022 0.5  0.2 - 1.2 mg/dL Final   . Alkaline phosphatase, Serum/Plasma 08/29/2022 101  37 - 153 U/L Final   . Aspartate Aminotransferase (AST), * 08/29/2022 17  10 - 35 U/L Final   . Alanine Aminotransferase (ALT), Se* 08/29/2022 16  6 - 29 U/L Final   . Hemoglobin A1c/Hemoglobin, total, * 08/29/2022 5.4  <5.7 % of total Hgb Final    Comment: For the purpose of screening for the presence of  diabetes:     <5.7%       Consistent with the absence of diabetes  5.7-6.4%    Consistent with increased risk for diabetes              (prediabetes)  > or =6.5%  Consistent with diabetes     This assay result is consistent with a decreased risk  of diabetes.     Currently, no consensus exists regarding  use of  hemoglobin A1c for diagnosis of diabetes in children.     According to American Diabetes Association (ADA)  guidelines, hemoglobin A1c <7.0% represents optimal  control in non-pregnant diabetic patients. Different  metrics may apply to specific patient populations.   Standards of Medical Care in Diabetes(ADA).         . Cholesterol, Serum/Plasma 08/29/2022 183  <200 mg/dL Final   . Cholesterol, HDL, Serum/Plasma 08/29/2022 58  > OR = 50 mg/dL Final   . Triglyceride, Serum/Plasma 08/29/2022 82  <150 mg/dL Final   . Cholesterol, LDL, Serum/Plasma 08/29/2022 108 (H)  mg/dL (calc) Final    Comment: Reference range: <100     Desirable range <100 mg/dL for primary prevention;    <70 mg/dL for patients with CHD or diabetic patients   with > or = 2 CHD risk factors.     LDL-C is now calculated using the Martin-Hopkins   calculation, which is a validated novel method providing   better accuracy than the Friedewald equation in the   estimation of LDL-C.   Gladis APPLETHWAITE et al. SANDREA. 7986;689(80): 2061-2068   (http://education.[facility].com/faq/FAQ164)     . Cholesterol, total/Cholesterol, HD*  08/29/2022 3.2  <5.0 (calc) Final   . Cholesterol non HDL, Serum/Plasma 08/29/2022 125  <130 mg/dL (calc) Final    Comment: For patients with diabetes plus 1 major ASCVD risk   factor, treating to a non-HDL-C goal of <100 mg/dL   (LDL-C of <29 mg/dL) is considered a therapeutic   option.     . Cobalamin (Vitamin B12), Serum/Pla* 08/29/2022 579  200 - 1,100 pg/mL Final   . Folate, Serum/Plasma 08/29/2022 >24.0  ng/mL Final    Comment:                            Reference Range                             Low:           <3.4                             Borderline:    3.4-5.4                             Normal:        >5.4        . Calcidiol, Serum/Plasma 08/29/2022 23 (L)  30 - 100 ng/mL Final    Comment: Vitamin D Status         25-OH Vitamin D:     Deficiency:                    <20 ng/mL  Insufficiency:             20 - 29 ng/mL  Optimal:                 > or = 30 ng/mL     For 25-OH Vitamin D testing on patients on   D2-supplementation and patients for whom quantitation   of D2 and D3 fractions is required, the [facility](TM)  25-OH VIT D, (D2,D3), LC/MS/MS is recommended: order   code 07111 (patients >55yrs).     See Note 1     Note 1     For additional  information, please refer to   http://education.[facility].com/faq/FAQ199   (This link is being provided for informational/  educational purposes only.)     . Color of Urine 08/29/2022 YELLOW  YELLOW Final   . Appearance of Urine 08/29/2022 CLEAR  CLEAR Final   . Specific gravity of Urine 08/29/2022 1.007  1.001 - 1.035 Final   . pH of Urine 08/29/2022 7.0  5.0 - 8.0 Final   . Glucose, Urine 08/29/2022 NEGATIVE  NEGATIVE Final   . Bilirubin, total, Urine 08/29/2022 NEGATIVE  NEGATIVE Final   . Ketones, Urine 08/29/2022 TRACE (A)  NEGATIVE Final   . Hemoglobin, Urine 08/29/2022 NEGATIVE  NEGATIVE Final   . Protein, Urine 08/29/2022 NEGATIVE  NEGATIVE Final   . Nitrite, Urine 08/29/2022 NEGATIVE  NEGATIVE Final   . Leukocyte esterase, Urine 08/29/2022  NEGATIVE  NEGATIVE Final   . Leukocytes, Urine sediment 08/29/2022 CANCELED  < OR = 5 /HPF Final    Result canceled by the ancillary.   . Erythrocytes, Urine sediment 08/29/2022 CANCELED  < OR = 2 /HPF Final    Result canceled by the ancillary.   SABRA Epithelial cells, squamous, Urine * 08/29/2022 CANCELED  < OR = 5 /HPF Final    Result canceled by the ancillary.   . Transitional cells, Urine sediment 08/29/2022 CANCELED  < OR = 5 /HPF Final    Result canceled by the ancillary.   SABRA Epithelial cells, renal, Urine sed* 08/29/2022 CANCELED  < OR = 3 /HPF Final    Result canceled by the ancillary.   . Bacteria, Urine sediment 08/29/2022 CANCELED  NONE SEEN /HPF Final    Result canceled by the ancillary.   . Calcium oxalate crystals, Urine se* 08/29/2022 CANCELED  NONE OR FEW /HPF Final    Result canceled by the ancillary.   . Triple phosphate crystals, Urine s* 08/29/2022 CANCELED  NONE OR FEW /HPF Final    Result canceled by the ancillary.   . Urate crystals, Urine sediment 08/29/2022 CANCELED  NONE OR FEW /HPF Final    Result canceled by the ancillary.   . Amorphous sediment, Urine sediment 08/29/2022 CANCELED  NONE OR FEW /HPF Final    Result canceled by the ancillary.   . Crystals, Urine sediment 08/29/2022 CANCELED  NONE SEEN  /HPF Final    Result canceled by the ancillary.   SABRA Hyaline casts, Urine sediment 08/29/2022 CANCELED  NONE SEEN /LPF Final    Result canceled by the ancillary.   . Granular casts, Urine sediment 08/29/2022 CANCELED  NONE SEEN /LPF Final    Result canceled by the ancillary.   . Casts, Urine sediment 08/29/2022 CANCELED  NONE SEEN /LPF Final    Result canceled by the ancillary.   SABRA Leaf, Urine sediment 08/29/2022 CANCELED  NONE SEEN /HPF Final    Result canceled by the ancillary.   . Service comment 08/29/2022 CANCELED   Final    Result canceled by the ancillary.   . TSH, Serum/Plasma 08/29/2022 1.45  mIU/L Final    Comment:           Reference Range                         > or = 20 Years   0.40-4.50                              Pregnancy Ranges  First trimester    0.26-2.66            Second trimester   0.55-2.73            Third trimester    0.43-2.91     . Culture, Urine, Routine 08/29/2022 SEE NOTE (A)   Final    Comment:     CULTURE, URINE, ROUTINE         Micro Number:      [id number]    Test Status:       Final    Specimen Source:   Urine, clean catch    Specimen Quality:  Adequate    Result:            1,000-9,000 CFU/ML of Group B Streptococcus isolated                       Any amount of group B Streptococcus in urine                       specimens obtained from pregnant females is a                       marker of genital tract colonization. If this                       patient is pregnant, please refer to ACOG                       guidelines for appropriate screening and                       management of pregnant women.                       Beta-hemolytic streptococci are predictably                       susceptible to Penicillin and other beta-lactams.                       Susceptibility testing not routinely performed.                       Please contact the laboratory within 3 days if                       susceptibility testing is desired.      Comment:           A portion of the resu                           lts were performed at [facility].           Assessment/Plan  Diagnoses and all orders for this visit:  Routine general medical examination at a health care facility  -     Urinalysis w/ Reflex to Microscopic; Future  Essential (primary) hypertension  Vitamin D deficiency, not otherwise specified  Gastro-esophageal reflux disease without esophagitis, not otherwise specified  Uncomplicated mild intermittent asthma, not otherwise specified  Other orders  -     fluticasone (CUTIVATE) 0.05 % cream; Apply topically 2 (two) times a day for 10 days          Problem Specific Notes:  No problem-specific Assessment &  Plan notes found for this encounter.      MIPS Measures  requiring follow-up  Blood Pressure for this visit is 130/90. The follow up plan to address blood pressure is increase amlodipine  to 10mg .  The patient should return for BP check in 2 months.        Recommendations:  Increase amlodipine  to 10mg , 1 tablet daily  GI referral for GERD, needs colonoscopy screening and EGD  Mammogram screening ordered  Annual labs ordered  Next visit is annual exam      There are no Patient Instructions on file for this visit.  Health Maintenance Due   Topic Date Due   . Pneumococcal PPSV23 Highest Risk Adult (1 of 3 - PCV13) Never done        Orders Placed This Encounter   Procedures   . Urinalysis w/ Reflex to Microscopic     Standing Status:   Future     Number of Occurrences:   1     Standing Expiration Date:   01/25/2024       Future Appointments  No follow-ups on file.            Today's Medication Changes           * Accurate as of Jan 25, 2023 11:59 PM. If you have any questions, ask your nurse or doctor.                START taking these medications      fluticasone 0.05 % cream  Commonly known as: CUTIVATE  Apply topically 2 (two) times a day for 10 days  Started by: Felisa Galloway, MD               Where to Get Your Medications        These medications were sent to [facility] [address], [place] - [address]  [address] [place] [address]      Phone: [phone]   fluticasone 0.05 % cream           *Some images could not be shown.

## 2024-03-26 ENCOUNTER — Emergency Department

## 2024-03-26 ENCOUNTER — Emergency Department
Admission: EM | Admit: 2024-03-26 | Discharge: 2024-03-26 | Disposition: A | Discharge status: Home / Self Care | Attending: Emergency Medicine | Admitting: Emergency Medicine

## 2024-03-26 DIAGNOSIS — M545 Low back pain, unspecified: Secondary | ICD-10-CM | POA: Insufficient documentation

## 2024-03-26 LAB — URINE HCG QUALITATIVE: Urine HCG Qualitative: NEGATIVE

## 2024-03-26 LAB — URINALYSIS WITH REFLEX TO MICROSCOPIC EXAM - REFLEX TO CULTURE
Urine Bilirubin: NEGATIVE
Urine Blood: NEGATIVE
Urine Glucose: NEGATIVE
Urine Ketones: NEGATIVE mg/dL
Urine Leukocyte Esterase: NEGATIVE
Urine Nitrite: NEGATIVE
Urine Protein: NEGATIVE
Urine Specific Gravity: 1.009 (ref 1.001–1.035)
Urine Urobilinogen: NORMAL mg/dL (ref 0.2–2.0)
Urine pH: 6 (ref 5.0–8.0)

## 2024-03-26 LAB — LAB USE ONLY - CBC WITH DIFFERENTIAL
Absolute Basophils: 0.03 x10 3/uL (ref 0.00–0.08)
Absolute Eosinophils: 0.11 x10 3/uL (ref 0.00–0.44)
Absolute Immature Granulocytes: 0.01 x10 3/uL (ref 0.00–0.07)
Absolute Lymphocytes: 3.02 x10 3/uL (ref 0.42–3.22)
Absolute Monocytes: 0.33 x10 3/uL (ref 0.21–0.85)
Absolute Neutrophils: 1.82 x10 3/uL (ref 1.10–6.33)
Absolute nRBC: 0 x10 3/uL (ref <=0.00)
Basophils %: 0.6 %
Eosinophils %: 2.1 %
Hematocrit: 43.1 % (ref 34.7–43.7)
Hemoglobin: 14.4 g/dL (ref 11.4–14.8)
Immature Granulocytes %: 0.2 %
Lymphocytes %: 56.8 %
MCH: 28.9 pg (ref 25.1–33.5)
MCHC: 33.4 g/dL (ref 31.5–35.8)
MCV: 86.4 fL (ref 78.0–96.0)
MPV: 10.1 fL (ref 8.9–12.5)
Monocytes %: 6.2 %
Neutrophils %: 34.1 %
Platelet Count: 297 x10 3/uL (ref 142–346)
Preliminary Absolute Neutrophil Count: 1.82 x10 3/uL (ref 1.10–6.33)
RBC: 4.99 x10 6/uL (ref 3.90–5.10)
RDW: 14 % (ref 11–15)
WBC: 5.32 x10 3/uL (ref 3.10–9.50)
nRBC %: 0 /100{WBCs} (ref <=0.0)

## 2024-03-26 LAB — COMPREHENSIVE METABOLIC PANEL
ALT: 37 U/L (ref <=55)
AST (SGOT): 44 U/L — ABNORMAL HIGH (ref <=41)
Albumin/Globulin Ratio: 1.1 (ref 0.9–2.2)
Albumin: 4.2 g/dL (ref 3.5–5.0)
Alkaline Phosphatase: 97 U/L (ref 37–117)
Anion Gap: 8 (ref 5.0–15.0)
BUN: 9 mg/dL (ref 7–21)
Bilirubin, Total: 0.6 mg/dL (ref 0.2–1.2)
CO2: 23 meq/L (ref 17–29)
Calcium: 10.4 mg/dL (ref 8.5–10.5)
Chloride: 109 meq/L (ref 99–111)
Creatinine: 0.6 mg/dL (ref 0.4–1.0)
GFR: >60 mL/min/1.73 m2 (ref >=60.0)
Globulin: 3.7 g/dL — ABNORMAL HIGH (ref 2.0–3.6)
Glucose: 80 mg/dL (ref 70–100)
Potassium: 4.3 meq/L (ref 3.5–5.3)
Protein, Total: 7.9 g/dL (ref 6.0–8.3)
Sodium: 140 meq/L (ref 135–145)

## 2024-03-26 MED ORDER — MORPHINE SULFATE 4 MG/ML IJ/IV SOLN (WRAP)
4.0000 mg | Freq: Once | Status: AC
Start: 2024-03-26 — End: 2024-03-26
  Administered 2024-03-26: 4 mg via INTRAVENOUS
  Filled 2024-03-26: qty 1

## 2024-03-26 MED ORDER — NAPROXEN 500 MG PO TABS
500.0000 mg | ORAL_TABLET | Freq: Two times a day (BID) | ORAL | 0 refills | Status: DC | PRN
Start: 2024-03-26 — End: 2024-03-26

## 2024-03-26 MED ORDER — CYCLOBENZAPRINE HCL 5 MG PO TABS
5.0000 mg | ORAL_TABLET | Freq: Three times a day (TID) | ORAL | 0 refills | Status: DC | PRN
Start: 2024-03-26 — End: 2024-03-26

## 2024-03-26 MED ORDER — ONDANSETRON HCL 4 MG/2ML IJ SOLN
4.0000 mg | Freq: Once | INTRAMUSCULAR | Status: AC
Start: 2024-03-26 — End: 2024-03-26
  Administered 2024-03-26: 4 mg via INTRAVENOUS
  Filled 2024-03-26: qty 2

## 2024-03-26 MED ORDER — LIDOCAINE 5 % EX PTCH
1.0000 | MEDICATED_PATCH | CUTANEOUS | 0 refills | Status: AC
Start: 2024-03-26 — End: ?

## 2024-03-26 MED ORDER — CYCLOBENZAPRINE HCL 5 MG PO TABS
5.0000 mg | ORAL_TABLET | Freq: Three times a day (TID) | ORAL | 0 refills | Status: AC | PRN
Start: 2024-03-26 — End: ?

## 2024-03-26 MED ORDER — KETOROLAC TROMETHAMINE 30 MG/ML IJ SOLN
30.0000 mg | Freq: Once | INTRAMUSCULAR | Status: AC
Start: 2024-03-26 — End: 2024-03-26
  Administered 2024-03-26: 30 mg via INTRAVENOUS
  Filled 2024-03-26: qty 1

## 2024-03-26 MED ORDER — NAPROXEN 500 MG PO TABS
500.0000 mg | ORAL_TABLET | Freq: Two times a day (BID) | ORAL | 0 refills | Status: AC | PRN
Start: 2024-03-26 — End: ?

## 2024-03-26 MED ORDER — LIDOCAINE 5 % EX PTCH
1.0000 | MEDICATED_PATCH | CUTANEOUS | 0 refills | Status: DC
Start: 2024-03-26 — End: 2024-03-26

## 2024-03-26 NOTE — ED Provider Notes (Signed)
 EMERGENCY DEPARTMENT HISTORY AND PHYSICAL EXAM     None        Date: 03/26/2024  Patient Name: Wendy Preston    History of Presenting Illness     Chief Complaint   Patient presents with    Back Pain    UTI       History Provided By: Patient      Additional History: Wendy Preston is a 53 y.o. female presenting to the ED with a chief complaint of back pain.  Patient states that symptoms started yesterday.  Denies inciting event,, denies fall, heavy lifting, other trauma.  Pain worsened today.  It is located bilaterally in her low back with radiation into her lower abdomen/pelvis.  Worse with sitting, better with standing and walking.  Some radiation into the hips, no radiation of pain down the legs.  Denies numbness or weakness in the legs, denies numbness in the genital area, no urinary retention or bowel incontinence.  Reports slight discomfort with urination, no increased frequency or urgency, no hematuria.  Denies prior history of similar back pain.  No prior lumbar surgeries.  No fever or chills.  History of hypertension and asthma.  Denies prior history of kidney stones or other chronic abdominal problems.  No pain medications taken this afternoon.    PCP: Hart Mimes, MD  SPECIALISTS:    Current Medications[1]        Past History     Past Medical History:  Past Medical History:   Diagnosis Date    Asthma without status asthmaticus     Hypertension        Past Surgical History:  Past Surgical History[2]    Family History:  Family History[3]    Social History:  Social History[4]    Allergies:  Allergies[5]    Review of Systems         Review of Systems   Constitutional:  Negative for chills and fever.   Eyes:  Negative for visual disturbance.   Respiratory:  Negative for shortness of breath.    Cardiovascular:  Negative for chest pain.   Gastrointestinal:  Positive for abdominal pain.   Genitourinary:  Positive for pelvic pain.   Musculoskeletal:  Positive for back pain.   Skin:  Negative for color change.    Neurological:  Negative for weakness and numbness.   Psychiatric/Behavioral:  Negative for confusion.          Physical Exam   BP (!) 158/97   Pulse 74   Temp 97.8 F (36.6 C) (Oral)   Resp 18   Ht 5' 8 (1.727 m)   Wt 106.7 kg   SpO2 97%   BMI 35.76 kg/m         Physical Exam  Vitals and nursing note reviewed.   Constitutional:       Appearance: She is obese.      Comments: Awake and alert, appears uncomfortable due to symptoms   HENT:      Head: Normocephalic and atraumatic.      Right Ear: External ear normal.      Left Ear: External ear normal.      Nose: Nose normal.      Mouth/Throat:      Mouth: Mucous membranes are moist.      Pharynx: Oropharynx is clear.   Eyes:      Conjunctiva/sclera: Conjunctivae normal.   Cardiovascular:      Rate and Rhythm: Normal rate and regular rhythm.  Pulses: Normal pulses.      Heart sounds: Normal heart sounds. No murmur heard.     No friction rub. No gallop.   Pulmonary:      Effort: Pulmonary effort is normal.      Breath sounds: Normal breath sounds. No wheezing, rhonchi or rales.   Abdominal:      General: Bowel sounds are normal.      Palpations: Abdomen is soft.      Tenderness: There is abdominal tenderness. There is right CVA tenderness and left CVA tenderness. There is no guarding or rebound.      Comments: Bilateral CVA tenderness, mild bilateral lower abdominal tenderness, no guarding or rebound   Musculoskeletal:         General: Tenderness present.      Cervical back: Normal range of motion and neck supple.      Comments: No midline tenderness over the lumbar spine, bilateral lumbar muscular tenderness and spasm, negative straight leg raise  No swelling in the legs, no calf tenderness, no palpable cord, 2+ DP pulses, cap refill less than 2 seconds, no pain with passive stretch   Skin:     General: Skin is warm and dry.      Capillary Refill: Capillary refill takes less than 2 seconds.   Neurological:      Mental Status: She is alert and oriented to  person, place, and time.      Cranial Nerves: No cranial nerve deficit.      Sensory: No sensory deficit.      Motor: No weakness.      Comments: Strength 5 out of 5 bilateral lower extremities, normal and symmetric dorsiflexion and plantarflexion of the great toes, no sensory deficit   Psychiatric:         Mood and Affect: Mood normal.         Behavior: Behavior normal.             Diagnostic Study Results     Labs -     Results for orders placed or performed during the hospital encounter of 03/26/24 (from the past 24 hours)   Urinalysis with Reflex to Microscopic Exam and Culture    Collection Time: 03/26/24  3:41 PM    Specimen: Urine, Clean Catch   Result Value    Urine Color Straw    Urine Clarity Clear    Urine Specific Gravity 1.009    Urine pH 6.0    Urine Leukocyte Esterase Negative    Urine Nitrite Negative    Urine Protein Negative    Urine Glucose Negative    Urine Ketones Negative    Urine Urobilinogen Normal    Urine Bilirubin Negative    Urine Blood Negative    Collection Time: 03/26/24  3:41 PM   Result Value    Urine HCG Qualitative Negative   Comprehensive Metabolic Panel    Collection Time: 03/26/24  4:30 PM   Result Value    Glucose 80    BUN 9    Creatinine 0.6    Sodium 140    Potassium 4.3    Chloride 109    CO2 23    Calcium 10.4    Anion Gap 8.0    GFR >60.0    AST (SGOT) 44 (H)    ALT 37    Alkaline Phosphatase 97    Albumin 4.2    Protein, Total 7.9    Globulin 3.7 (H)    Albumin/Globulin Ratio  1.1    Bilirubin, Total 0.6   CBC with Differential (Component)    Collection Time: 03/26/24  4:30 PM   Result Value    WBC 5.32    Hemoglobin 14.4    Hematocrit 43.1    Platelet Count 297    MPV 10.1    RBC 4.99    MCV 86.4    MCH 28.9    MCHC 33.4    RDW 14    nRBC % 0.0    Absolute nRBC 0.00    Preliminary Absolute Neutrophil Count 1.82    Neutrophils % 34.1    Lymphocytes % 56.8    Monocytes % 6.2    Eosinophils % 2.1    Basophils % 0.6    Immature Granulocytes % 0.2    Absolute Neutrophils 1.82     Absolute Lymphocytes 3.02    Absolute Monocytes 0.33    Absolute Eosinophils 0.11    Absolute Basophils 0.03    Absolute Immature Granulocytes 0.01       Radiologic Studies -   CT Abd/ Pelvis without Contrast  Result Date: 03/26/2024  No definite acute pathology. Toribio Hering, MD 03/26/2024 5:29 PM        Medical Decision Making   I am the first provider for this patient.    I reviewed the vital signs, available nursing notes, past medical history, past surgical history, family history and social history.    Vital Signs-Reviewed the patient's vital signs.   Patient Vitals for the past 12 hrs:   BP Temp Pulse Resp   03/26/24 1725 123/84 -- 60 18   03/26/24 1635 144/89 -- 67 --   03/26/24 1522 (!) 158/97 97.8 F (36.6 C) 74 18       Pulse Oximetry Analysis - Normal 99% on RA    Cardiac Monitor:  Rate: 60  Rhythm:  Normal Sinus Rhythm     Old Medical Records: Old medical records.  Nursing notes.     Procedure:     ED Course:     5:41 PM-  Counseled on diagnosis, f/u plans, medication use, home/self care, and signs and symptoms when to return to ED. Discussed diagnostic uncertainty and possibility of progression of illness. All questions from patient solicited and addressed. Pt expresses understanding, is stable, and amenable to discharge.             Provider Notes:     Medical Decision Making  Patient presents with bilateral back pain that wraps around to her flanks and lower abdomen.  Vital signs are normal, patient uncomfortable but nontoxic appearing.  No trauma, no midline tenderness, doubt spinal fracture.  Neurological exam is normal, no red flags for back pain, doubt cauda equina syndrome, cord compression, epidural abscess, do not feel that emergent MRI is indicated.  UA with no evidence of blood or infection, urine hCG negative.  No leukocytosis, no anemia, normal platelets, normal renal function and electrolytes.  Given some component of flank pain, imaging obtained, CT abdomen and pelvis interpreted by  me, confirmed by radiology, no evidence of ureteral stone, pyelonephritis, perforated viscus, obstruction, other surgical pathology.  Patient was given IV Toradol  and morphine , felt significantly improved.  Ambulatory on reassessment.  Discussed musculoskeletal back pain/lumbar strain, discussed plan for discharge with NSAIDs, muscle relaxants, Lidoderm  patches.  Reviewed reasons to return to the ED    Problems Addressed:  Acute bilateral low back pain without sciatica: acute illness or injury with systemic symptoms  Amount and/or Complexity of Data Reviewed  Independent Historian: friend  External Data Reviewed: notes.     Details: 01/25/2024 primary care office visit for annual exam  Labs: ordered. Decision-making details documented in ED Course.  Radiology: ordered and independent interpretation performed. Decision-making details documented in ED Course.     Details: CT abdomen and pelvis interpreted by me, confirmed by radiology, no acute process    Risk  OTC drugs.  Prescription drug management.  Parenteral controlled substances.          Diagnosis     Clinical Impression:   1. Acute bilateral low back pain without sciatica        Treatment Plan:   ED Disposition       ED Disposition   Discharge    Condition   --    Date/Time   Thu Mar 26, 2024  5:41 PM    Comment   [place] LITTIE Hint discharge to home/self care.    Condition at disposition: Stable                   _______________________________      Dr. Comer Lever, MD, is the primary provider for this patient.   _______________________________         [1]   Current Facility-Administered Medications   Medication Dose Route Frequency Provider Last Rate Last Admin    ketorolac  (TORADOL ) injection 30 mg  30 mg Intravenous Once Lever Comer DEL, MD        morphine  injection 4 mg  4 mg Intravenous Once Navneet Schmuck H, MD        ondansetron  (ZOFRAN ) injection 4 mg  4 mg Intravenous Once Lever Comer DEL, MD         Current Outpatient Medications    Medication Sig Dispense Refill    albuterol  sulfate HFA (PROVENTIL ) 108 (90 Base) MCG/ACT inhaler Inhale 1 puff into the lungs every 6 (six) hours as needed      amLODIPine  (NORVASC ) 10 MG tablet Take 1 tablet (10 mg total) by mouth daily. 30 tablet 0    Breyna 80-4.5 MCG/ACT inhaler       fexofenadine (ALLEGRA) 180 MG tablet Take 1 tablet (180 mg) by mouth      losartan (COZAAR) 100 MG tablet Take 1 tablet (100 mg) by mouth once daily      montelukast (SINGULAIR) 10 MG tablet Take 1 tablet (10 mg) by mouth once at bedtime      Multiple Vitamins-Minerals (Multi Vitamin/Minerals) Tablet Take 1 tablet by mouth once every morning      pantoprazole  (PROTONIX ) 40 MG tablet      [2]   Past Surgical History:  Procedure Laterality Date    CESAREAN SECTION     [3] No family history on file.  [4]   Social History  Tobacco Use    Smoking status: Former    Smokeless tobacco: Current   Vaping Use    Vaping status: Never Used   Substance Use Topics    Alcohol use: Yes    Drug use: No   [5]   Allergies  Allergen Reactions    Penicillins     Latex Rash and Other (See Comments)     Other Reaction(s): Unknown      Bumpy rash    Bumpy rash        Lever Comer DEL, MD  03/29/24 [phone]

## 2024-03-27 LAB — LAB USE ONLY - URINE GRAY CULTURE HOLD TUBE

## 2024-04-18 ENCOUNTER — Other Ambulatory Visit (INDEPENDENT_AMBULATORY_CARE_PROVIDER_SITE_OTHER): Payer: Self-pay

## 2024-05-14 ENCOUNTER — Other Ambulatory Visit (INDEPENDENT_AMBULATORY_CARE_PROVIDER_SITE_OTHER): Payer: Self-pay | Admitting: Internal Medicine
